# Patient Record
Sex: Male | Born: 1975 | Race: Asian | Hispanic: No | Marital: Married | Smoking: Never smoker
Health system: Southern US, Community
[De-identification: ages and names within clinical notes are randomized; demographics above are authoritative.]

## PROBLEM LIST (undated history)

## (undated) DIAGNOSIS — Z8619 Personal history of other infectious and parasitic diseases: Secondary | ICD-10-CM

## (undated) DIAGNOSIS — J309 Allergic rhinitis, unspecified: Secondary | ICD-10-CM

## (undated) HISTORY — DX: Allergic rhinitis, unspecified: J30.9

## (undated) HISTORY — DX: Personal history of other infectious and parasitic diseases: Z86.19

---

## 2007-05-09 ENCOUNTER — Ambulatory Visit: Payer: Self-pay | Admitting: General Surgery

## 2007-05-29 ENCOUNTER — Ambulatory Visit: Payer: Self-pay | Admitting: General Surgery

## 2007-09-06 HISTORY — PX: NECK SURGERY: SHX720

## 2009-04-16 IMAGING — CT CT NECK WITH CONTRAST
1 of 2 series · 9 of 14 positions shown, 12 images · non-contrast
Comparison: none

REASON FOR EXAM: neck mass
COMMENTS:

[Series 2: soft tissue · axial · 0.45mm/px · z∈[-412,-196]mm · 9 of 92 slices shown, 12 images]
[im 10/92  soft-tissue]
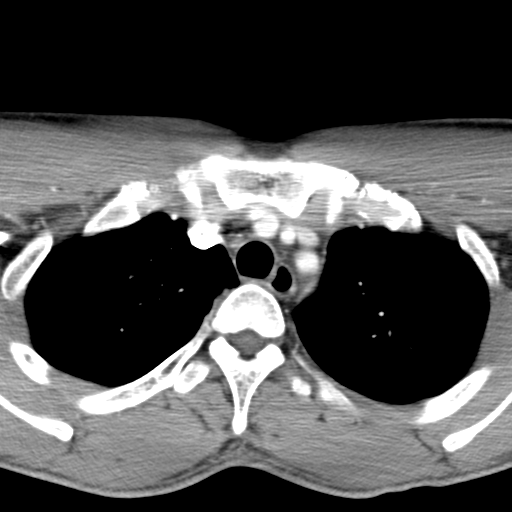
[im 10/92  bone]
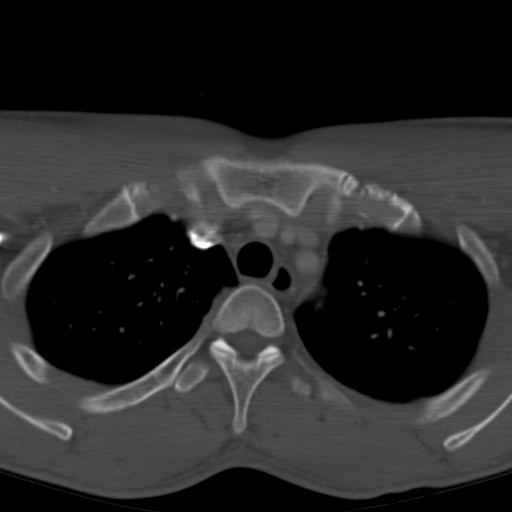
[im 19/92  bone]
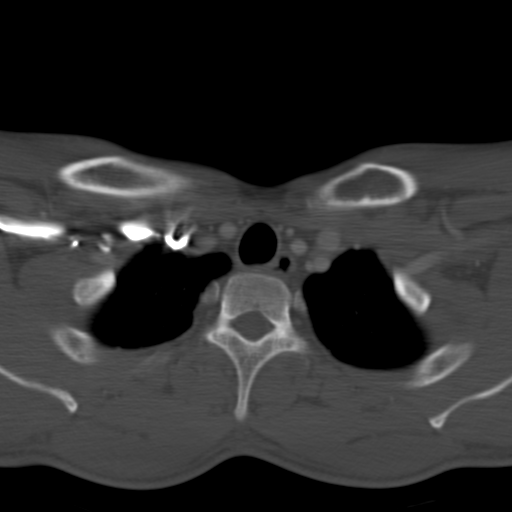
[im 28/92  bone]
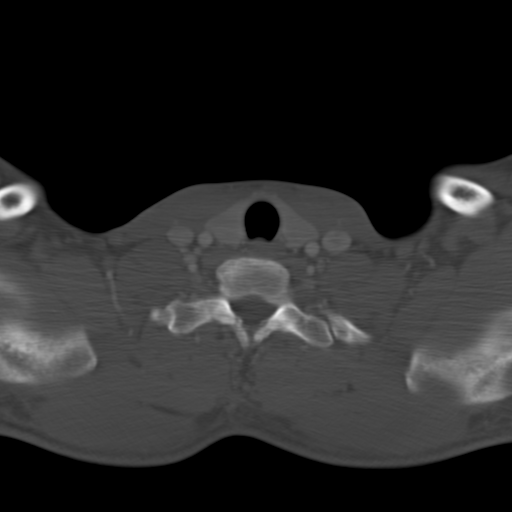
[im 37/92  bone]
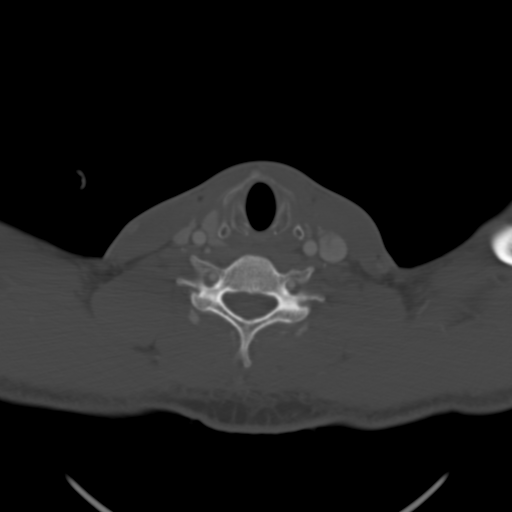
[im 46/92  soft-tissue]
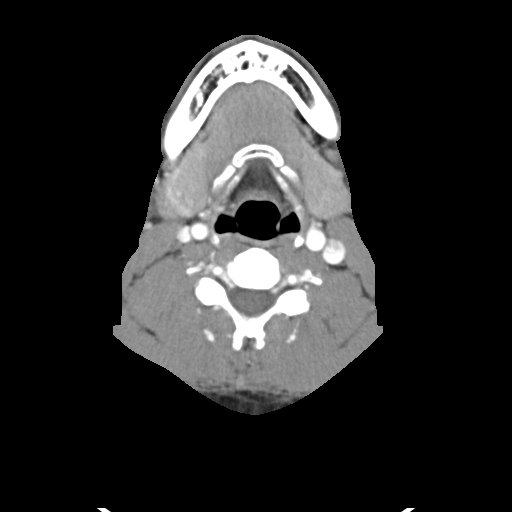
[im 46/92  bone]
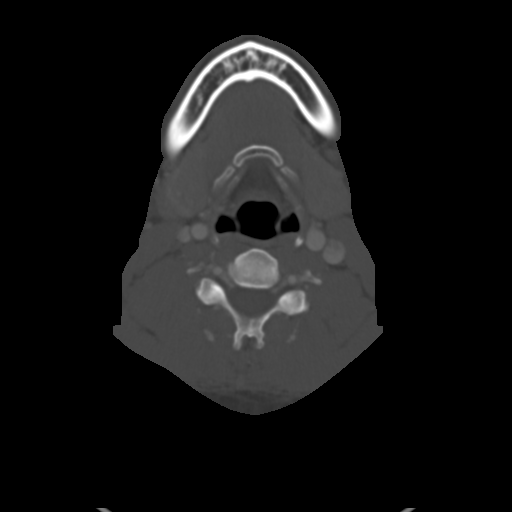
[im 55/92  bone]
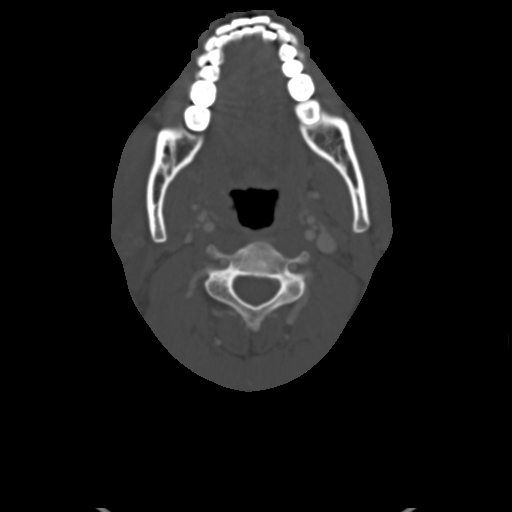
[im 64/92  bone]
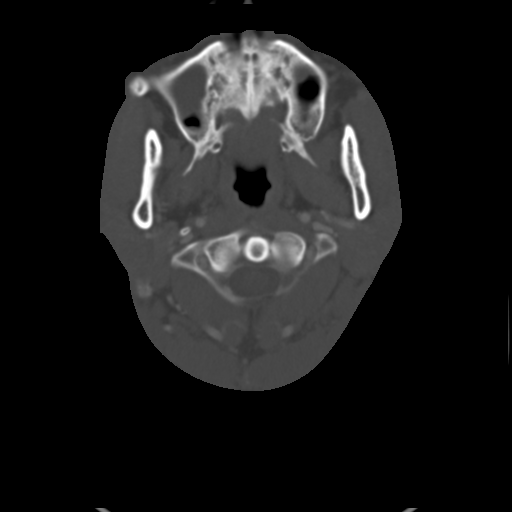
[im 73/92  bone]
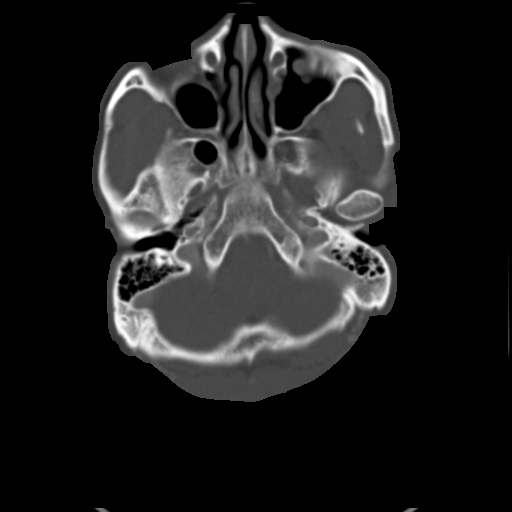
[im 82/92  soft-tissue]
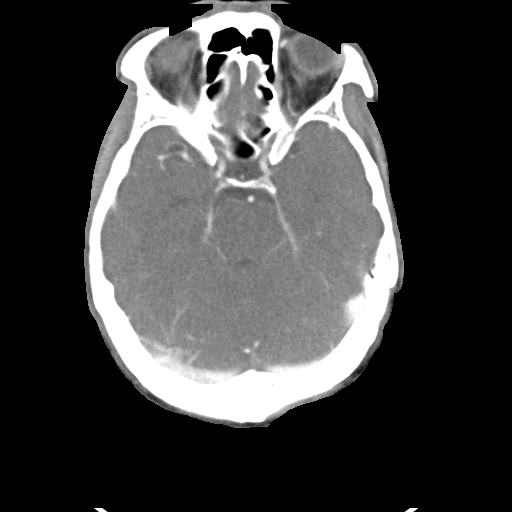
[im 82/92  bone]
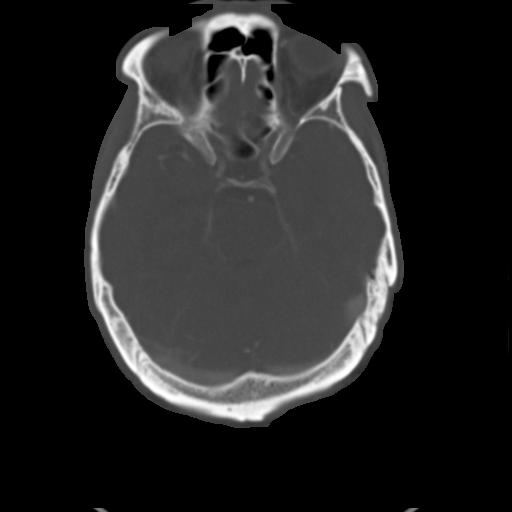

[9 of 14 positions shown; findings below may reference images not displayed]

PROCEDURE:     CT  - CT NECK WITH CONTRAST  - May 09, 2007  [DATE]

RESULT:     The patient has a mass in the region of the lower neck. The area
was marked with a metallic marker. The patient received 75 ml of Bsovue-KTL.

There is increased thickness of the subcutaneous fat in the midline over the
mid and lower neck. There are septations within this fat. There is no
discrete solid mass. I do not see lymphadenopathy in the surrounding soft
tissues. There is a relative paucity of subcutaneous and deeper fat
elsewhere in the neck.

The parotid and submandibular glands are normal in appearance. The laryngeal
structures are normal in appearance. The prevertebral soft tissues spaces
are normal. The thyroid lobes are symmetric in density and size. Muscles
deep to the area of increased thickness in the subcutaneous fat over the
lower neck are normal in appearance. The pulmonary apices are normal in
appearance.
IMPRESSION: 1.     There is increased thickness of the subcutaneous fat over the mid and
lower neck posteriorly. The transverse dimension of this area is
approximately 6.6 cm. The AP dimension of the fat is 1.6 cm. The area is
approximately 5 cm in length.
2.     I see no acute abnormality elsewhere within the neck.
3.     The findings likely reflect a benign process but this area will merit
follow-up. MRI could be considered in an effort to better define the margins
though they are reasonably well defined on today's CT study.

## 2011-05-07 HISTORY — PX: ANAL FISSURE REPAIR: SHX2312

## 2011-05-27 ENCOUNTER — Ambulatory Visit: Payer: Self-pay | Admitting: General Surgery

## 2015-06-25 ENCOUNTER — Encounter: Payer: Self-pay | Admitting: Family Medicine

## 2015-06-25 ENCOUNTER — Ambulatory Visit (INDEPENDENT_AMBULATORY_CARE_PROVIDER_SITE_OTHER): Payer: Commercial Managed Care - PPO | Admitting: Family Medicine

## 2015-06-25 VITALS — BP 110/58 | HR 75 | Temp 98.3°F | Resp 16 | Ht 66.0 in | Wt 167.8 lb

## 2015-06-25 DIAGNOSIS — Z8619 Personal history of other infectious and parasitic diseases: Secondary | ICD-10-CM

## 2015-06-25 DIAGNOSIS — J309 Allergic rhinitis, unspecified: Secondary | ICD-10-CM | POA: Insufficient documentation

## 2015-06-25 DIAGNOSIS — B349 Viral infection, unspecified: Secondary | ICD-10-CM

## 2015-06-25 MED ORDER — AMOXICILLIN-POT CLAVULANATE 875-125 MG PO TABS: 1.0000 | ORAL_TABLET | Freq: Two times a day (BID) | ORAL | Status: DC

## 2015-06-25 NOTE — Patient Instructions (Signed)
Start Mucinex D. If sinuses not improving over the next two days start the antibiotics.

## 2015-06-25 NOTE — Progress Notes (Signed)
Subjective:     Patient ID: Christian Bentley, male   DOB: 02/21/1976, 39 y.o.   MRN: [id number]  HPI  Chief Complaint  Patient presents with  . Cough    Patient comes in office today with concerns of cough and congestion for the past 6 days. Patient was seen at Urgent care over the weekend flu swab was negative in office but physcian treated patient for flu based of symptoms he was displaying at visit. Patient reports that he completed Tamiflu and that the Benzonate has not helped much with cough. Patient complains of sinus pain and pressure which is causing him to sleep at night  Reports initial onset of symptoms on 10/13 with malaise. Subsequently developed flu-like sx with fever to 103. Concerned now about increased sinus pressure with clear to greenish drainage.   Review of Systems  Constitutional: Negative for fatigue (has abated along with body aches).  Respiratory: Positive for cough (intermittent).        Objective:   Physical Exam  Constitutional: He appears well-developed and well-nourished. No distress.  Ears: T.M's intact without inflammation Sinuses: non-tender Throat: no tonsillar enlargement or exudate Neck: no cervical adenopathy Lungs: clear     Assessment:    1. Viral syndrome - amoxicillin-clavulanate (AUGMENTIN) 875-125 MG tablet; Take 1 tablet by mouth 2 (two) times daily.  Dispense: 20 tablet; Refill: 0    Plan:    Add Mucinex D now. Add antibiotic in the next two days if sinuses not clearing.

## 2015-06-27 ENCOUNTER — Ambulatory Visit: Payer: Self-pay

## 2015-07-07 ENCOUNTER — Encounter: Payer: Self-pay | Admitting: Family Medicine

## 2015-07-14 ENCOUNTER — Encounter: Payer: Self-pay | Admitting: Family Medicine

## 2015-07-21 ENCOUNTER — Ambulatory Visit (INDEPENDENT_AMBULATORY_CARE_PROVIDER_SITE_OTHER): Payer: Commercial Managed Care - PPO | Admitting: Family Medicine

## 2015-07-21 ENCOUNTER — Encounter: Payer: Self-pay | Admitting: Family Medicine

## 2015-07-21 VITALS — BP 124/82 | HR 66 | Temp 98.5°F | Resp 16 | Ht 64.0 in | Wt 169.2 lb

## 2015-07-21 DIAGNOSIS — J301 Allergic rhinitis due to pollen: Secondary | ICD-10-CM | POA: Diagnosis not present

## 2015-07-21 DIAGNOSIS — Z0000 Encounter for general adult medical examination without abnormal findings: Secondary | ICD-10-CM | POA: Diagnosis not present

## 2015-07-21 DIAGNOSIS — Z23 Encounter for immunization: Secondary | ICD-10-CM | POA: Diagnosis not present

## 2015-07-21 MED ORDER — FLUTICASONE PROPIONATE 50 MCG/ACT NA SUSP: 2.0000 | Freq: Every day | NASAL | Status: DC

## 2015-07-21 NOTE — Progress Notes (Signed)
Subjective:     Patient ID: Christian Bentley, male   DOB: 04/26/1976, 39 y.o.   MRN: [id number]  HPI  Chief Complaint  Patient presents with  . Annual Exam    Patient comes in office today for his annual physical, his only concern today is to address ongoing cough and drainage for the past month,   States he did not need the antibiotic from ov. Of 10/20. Reports residual clear PND with occasional cough. 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   Review of Systems General: Feeling well HEENT: regular dental visits and eye exams. Had Lasik surgery 1.5 years ago. Cardiovascular: no chest pain, shortness of breath, or palpitations GI: no heartburn, no change in bowel habits  GU: nocturia x 0, no change in bladder habits  Psychiatric: not depressed Musculoskeletal: no joint pain. 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    Objective:   Physical Exam  Constitutional: He appears well-developed and well-nourished. No distress.  Eyes: PERRLA, EOMI Neck: no thyromegaly, tenderness or nodules. No cervical adenopathy.  ENT: TM's intact without inflammation; No tonsillar enlargement or exudate, Lungs: Clear Heart : RRR without murmur or gallop Abd: bowel sounds present, soft, non-tender, no organomegaly Extremities: right fourth finger with pronounced varus deformity. FF/FE strength at his tip is 5/5.     Assessment:    1. Need for influenza vaccination - Flu Vaccine QUAD 36+ mos IM  2. Annual physical exam - Comprehensive metabolic panel - Lipid panel  3. Allergic rhinitis due to polle - fluticasone (FLONASE) 50 MCG/ACT nasal spray; Place 2 sprays into both nostrils daily.  Dispense: 16 g; Refill: 6    Plan:    Further f/u pending lab work

## 2015-07-21 NOTE — Patient Instructions (Signed)
We will call you with the lab results. Try the steroid nasal spray and see if it clears your sinuses.

## 2015-07-22 ENCOUNTER — Telehealth: Payer: Self-pay

## 2015-07-22 LAB — COMPREHENSIVE METABOLIC PANEL
A/G RATIO: 1.5 (ref 1.1–2.5)
ALK PHOS: 64 IU/L (ref 39–117)
ALT: 43 IU/L (ref 0–44)
AST: 27 IU/L (ref 0–40)
Albumin: 4.6 g/dL (ref 3.5–5.5)
BUN / CREAT RATIO: 11 (ref 8–19)
BUN: 11 mg/dL (ref 6–20)
Bilirubin Total: 0.5 mg/dL (ref 0.0–1.2)
CO2: 26 mmol/L (ref 18–29)
Calcium: 9.8 mg/dL (ref 8.7–10.2)
Chloride: 101 mmol/L (ref 97–106)
Creatinine, Ser: 0.99 mg/dL (ref 0.76–1.27)
GFR calc Af Amer: 111 mL/min/{1.73_m2} (ref >59)
GFR calc non Af Amer: 96 mL/min/{1.73_m2} (ref >59)
GLOBULIN, TOTAL: 3.1 g/dL (ref 1.5–4.5)
Glucose: 92 mg/dL (ref 65–99)
Potassium: 4.8 mmol/L (ref 3.5–5.2)
Sodium: 142 mmol/L (ref 136–144)
Total Protein: 7.7 g/dL (ref 6.0–8.5)

## 2015-07-22 LAB — LIPID PANEL
CHOL/HDL RATIO: 4.4 ratio (ref 0.0–5.0)
CHOLESTEROL TOTAL: 224 mg/dL — AB (ref 100–199)
HDL: 51 mg/dL (ref >39)
LDL CALC: 146 mg/dL — AB (ref 0–99)
Triglycerides: 134 mg/dL (ref 0–149)
VLDL CHOLESTEROL CAL: 27 mg/dL (ref 5–40)

## 2015-07-22 NOTE — Telephone Encounter (Signed)
Patient has been advised. KW 

## 2015-07-22 NOTE — Telephone Encounter (Signed)
-----   Message from Anola Gurneyobert Chauvin, [place] sent at 07/22/2015  7:58 AM EST ----- Labs look ok with mildly elevated cholesterol. Recommend regular exercise (30 minutes daily) and low fat food choices. Would recheck cholesterol annually.

## 2016-06-16 DIAGNOSIS — Z23 Encounter for immunization: Secondary | ICD-10-CM | POA: Diagnosis not present

## 2016-09-14 ENCOUNTER — Ambulatory Visit (INDEPENDENT_AMBULATORY_CARE_PROVIDER_SITE_OTHER): Payer: BLUE CROSS/BLUE SHIELD | Admitting: Physician Assistant

## 2016-09-14 ENCOUNTER — Encounter: Payer: Self-pay | Admitting: Physician Assistant

## 2016-09-14 VITALS — BP 102/62 | HR 62 | Temp 98.5°F | Resp 16 | Wt 163.8 lb

## 2016-09-14 DIAGNOSIS — J019 Acute sinusitis, unspecified: Secondary | ICD-10-CM | POA: Diagnosis not present

## 2016-09-14 MED ORDER — AMOXICILLIN-POT CLAVULANATE 875-125 MG PO TABS: 1.0000 | ORAL_TABLET | Freq: Two times a day (BID) | ORAL | 0 refills | Status: AC

## 2016-09-14 NOTE — Patient Instructions (Signed)

## 2016-09-14 NOTE — Progress Notes (Signed)
Patient: Christian Bentley Male    DOB: [birth year]   41 y.o.   MRN: [id number] Visit Date: 09/14/2016  Today's Provider: Trey Sailors, PA-C   Chief Complaint  Patient presents with  . Facial Pain   Subjective:    HPI   Patient comes in office today with concerns of sinus pain and pressure over his entire face, patient reports that symptoms began 09/06/16.  Endorses purulent rhinorrhea, nasal congestion, sneezing. Has tried nettipot with no relief. Having difficulty training for run.   No Known Allergies   Current Outpatient Prescriptions:  .  ibuprofen (ADVIL,MOTRIN) 800 MG tablet, Take 800 mg by mouth every 8 (eight) hours as needed. for pain, Disp: , Rfl: 0 .  amoxicillin-clavulanate (AUGMENTIN) 875-125 MG tablet, Take 1 tablet by mouth 2 (two) times daily., Disp: 20 tablet, Rfl: 0 .  fluticasone (FLONASE) 50 MCG/ACT nasal spray, Place 2 sprays into both nostrils daily. (Patient not taking: Reported on 09/14/2016), Disp: 16 g, Rfl: 6  Review of Systems  Constitutional: Positive for appetite change, fatigue and fever. Negative for activity change, chills, diaphoresis and unexpected weight change.  HENT: Positive for postnasal drip, rhinorrhea, sinus pain, sinus pressure and sneezing. Negative for congestion, dental problem, drooling, ear discharge, ear pain, facial swelling, hearing loss, mouth sores, nosebleeds, sore throat, tinnitus, trouble swallowing and voice change.   Respiratory: Positive for cough and wheezing. Negative for apnea, choking, chest tightness, shortness of breath and stridor.   Cardiovascular: Negative.   Gastrointestinal: Negative.   Endocrine: Negative.   Genitourinary: Negative.   Musculoskeletal: Negative.   Skin: Negative.   Allergic/Immunologic: Negative.   Neurological: Negative.   Hematological: Negative.   Psychiatric/Behavioral: Negative.     Social History  Substance Use Topics  . Smoking status: Never Smoker  . Smokeless tobacco:  Never Used  . Alcohol use 0.0 oz/week     Comment: occasional   Objective:   BP 102/62   Pulse 62   Temp 98.5 F (36.9 C) (Oral)   Resp 16   Wt 163 lb 12.8 oz (74.3 kg)   SpO2 97%   BMI 28.12 kg/m   Physical Exam  Constitutional: He is oriented to person, place, and time. He appears well-developed and well-nourished. No distress.  HENT:  Right Ear: External ear normal.  Left Ear: External ear normal.  Nose: Right sinus exhibits maxillary sinus tenderness and frontal sinus tenderness. Left sinus exhibits maxillary sinus tenderness and frontal sinus tenderness.  Mouth/Throat: Oropharynx is clear and moist and mucous membranes are normal. No oropharyngeal exudate, posterior oropharyngeal edema, posterior oropharyngeal erythema or tonsillar abscesses.  Eyes: Conjunctivae are normal.  Neck: Normal range of motion.  Cardiovascular: Normal rate and regular rhythm.   Pulmonary/Chest: Effort normal and breath sounds normal.  Lymphadenopathy:    He has no cervical adenopathy.  Neurological: He is alert and oriented to person, place, and time.  Skin: Skin is warm and dry. He is not diaphoretic.  Psychiatric: He has a normal mood and affect. His behavior is normal.        Assessment & Plan:     1. Acute non-recurrent sinusitis, unspecified location  Treat as below. Call back if not better.  - amoxicillin-clavulanate (AUGMENTIN) 875-125 MG tablet; Take 1 tablet by mouth 2 (two) times daily.  Dispense: 20 tablet; Refill: 0  Return if symptoms worsen or fail to improve.  The entirety of the information documented in the History of Present Illness, Review of  Systems and Physical Exam were personally obtained by me. Portions of this information were initially documented by Natalia LeatherwoodKatherine and reviewed by me for thoroughness and accuracy.   Patient Instructions  Sinusitis, Adult Sinusitis is soreness and inflammation of your sinuses. Sinuses are hollow spaces in the bones around your face.  They are located:  Around your eyes.  In the middle of your forehead.  Behind your nose.  In your cheekbones. Your sinuses and nasal passages are lined with a stringy fluid (mucus). Mucus normally drains out of your sinuses. When your nasal tissues get inflamed or swollen, the mucus can get trapped or blocked so air cannot flow through your sinuses. This lets bacteria, viruses, and funguses grow, and that leads to infection. Follow these instructions at home: Medicines  Take, use, or apply over-the-counter and prescription medicines only as told by your doctor. These may include nasal sprays.  If you were prescribed an antibiotic medicine, take it as told by your doctor. Do not stop taking the antibiotic even if you start to feel better. Hydrate and Humidify  Drink enough water to keep your pee (urine) clear or pale yellow.  Use a cool mist humidifier to keep the humidity level in your home above 50%.  Breathe in steam for 10-15 minutes, 3-4 times a day or as told by your doctor. You can do this in the bathroom while a hot shower is running.  Try not to spend time in cool or dry air. Rest  Rest as much as possible.  Sleep with your head raised (elevated).  Make sure to get enough sleep each night. General instructions  Put a warm, moist washcloth on your face 3-4 times a day or as told by your doctor. This will help with discomfort.  Wash your hands often with soap and water. If there is no soap and water, use hand sanitizer.  Do not smoke. Avoid being around people who are smoking (secondhand smoke).  Keep all follow-up visits as told by your doctor. This is important. Contact a doctor if:  You have a fever.  Your symptoms get worse.  Your symptoms do not get better within 10 days. Get help right away if:  You have a very bad headache.  You cannot stop throwing up (vomiting).  You have pain or swelling around your face or eyes.  You have trouble seeing.  You  feel confused.  Your neck is stiff.  You have trouble breathing. This information is not intended to replace advice given to you by your health care provider. Make sure you discuss any questions you have with your health care provider. Document Released: 02/08/2008 Document Revised: 04/17/2016 Document Reviewed: 06/17/2015 Elsevier Interactive Patient Education  2017 Elsevier Inc.            Trey SailorsAdriana M Pollak, PA-C  [facility]

## 2017-05-12 ENCOUNTER — Encounter: Payer: Self-pay | Admitting: Family Medicine

## 2017-05-12 ENCOUNTER — Ambulatory Visit: Payer: BLUE CROSS/BLUE SHIELD | Admitting: Family Medicine

## 2017-05-12 ENCOUNTER — Ambulatory Visit (INDEPENDENT_AMBULATORY_CARE_PROVIDER_SITE_OTHER): Payer: BLUE CROSS/BLUE SHIELD | Admitting: Family Medicine

## 2017-05-12 VITALS — BP 100/70 | HR 54 | Temp 98.0°F | Resp 16 | Wt 167.4 lb

## 2017-05-12 DIAGNOSIS — R350 Frequency of micturition: Secondary | ICD-10-CM | POA: Diagnosis not present

## 2017-05-12 LAB — POCT URINALYSIS DIPSTICK
Bilirubin, UA: NEGATIVE
Blood, UA: NEGATIVE
GLUCOSE UA: NEGATIVE
KETONES UA: NEGATIVE
Leukocytes, UA: NEGATIVE
Nitrite, UA: NEGATIVE
Protein, UA: NEGATIVE
Urobilinogen, UA: 0.2 E.U./dL
pH, UA: 5 (ref 5.0–8.0)

## 2017-05-12 NOTE — Patient Instructions (Signed)
Taper caffeine slowly over two weeks and see if you urinate less. Let me know if not improving.

## 2017-05-12 NOTE — Progress Notes (Signed)
Subjective:     Patient ID: Christian Bentley, male   DOB: 04/10/1976, 41 y.o.   MRN: [id number]  HPI  Chief Complaint  Patient presents with  . Urinary Frequency    Patient comes in office today with conerns of urinary frequency and difficulty emptying his bladder for a period of 2 months or more.   States he consumes 2-3 cups of coffee, 1-2 sodas, and one beer daily. On the weekends will drink up to 12 beers. 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 No nocturia or family hx of prostate dz.   Review of Systems     Objective:   Physical Exam  Constitutional: He appears well-developed and well-nourished. No distress.       Assessment:    1. Urinary frequency - POCT urinalysis dipstick    Plan:    Discussed caffeine taper. Consider trial on anxiolytics or abx.

## 2017-10-19 ENCOUNTER — Ambulatory Visit (INDEPENDENT_AMBULATORY_CARE_PROVIDER_SITE_OTHER): Payer: BLUE CROSS/BLUE SHIELD | Admitting: Family Medicine

## 2017-10-19 ENCOUNTER — Encounter: Payer: Self-pay | Admitting: Family Medicine

## 2017-10-19 VITALS — BP 100/74 | HR 59 | Temp 97.7°F | Resp 16 | Ht 65.0 in | Wt 168.6 lb

## 2017-10-19 DIAGNOSIS — Z308 Encounter for other contraceptive management: Secondary | ICD-10-CM | POA: Diagnosis not present

## 2017-10-19 DIAGNOSIS — Z0000 Encounter for general adult medical examination without abnormal findings: Secondary | ICD-10-CM | POA: Diagnosis not present

## 2017-10-19 NOTE — Patient Instructions (Addendum)
We will call you with the lab results and the urology referral. Continue running for exercise.

## 2017-10-19 NOTE — Progress Notes (Signed)
Subjective:     Patient ID: Christian Bentley, male   DOB: 01/05/1976, 42 y.o.   MRN: [id number] Chief Complaint  Patient presents with  . Annual Exam    Patient comes in office today for his annual physical he states that he feels well today and has no complaints in concerns. Patient reports that he follows a well balanaced diet and is eating less pork, patient is staying active running 20miles a well and sleeping on average 6.5-7hrs a sleep. Patient has declined flu vaccine today.    HPI Remains married with two kids aged 788 and 4.  Continues as a Solicitorplant manager. Runs up to 20 miles a week and plays golf.  Review of Systems General: Feeling well HEENT: regular dental visits/ has had Lasik eye surgery. Reports recent right s.c.hemorrhage from sneezing which is nearly resolved Cardiovascular: no chest pain, shortness of breath, or palpitations GI: no heartburn, no change in bowel habits or blood in the stool GU: nocturia x 0, no change in bladder habits. Wishes referral for vasectomy. 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    Objective:   Physical Exam  Constitutional: He appears well-developed and well-nourished. No distress.  Eyes: PERRLA, EOMI, residual area of fading s.c.hemorrhae in right eye. Neck: no thyromegaly, tenderness or nodules,  ENT: TM's intact without inflammation; No tonsillar enlargement or exudate, Lungs: Clear Heart : RRR without murmur or gallop Abd: bowel sounds present, soft, non-tender, no organomegaly Extremities: no edema, Skin: no atypical lesions noted on his back.     Assessment:    1. Annual physical exam - Lipid panel - Comprehensive metabolic panel  2. Surveillance for birth control, vasectomy - Ambulatory referral to Urology    Plan:    Further f/u pending lab work.

## 2017-10-20 ENCOUNTER — Telehealth: Payer: Self-pay

## 2017-10-20 LAB — COMPREHENSIVE METABOLIC PANEL
A/G RATIO: 1.8 (ref 1.2–2.2)
ALK PHOS: 60 IU/L (ref 39–117)
ALT: 36 IU/L (ref 0–44)
AST: 32 IU/L (ref 0–40)
Albumin: 4.6 g/dL (ref 3.5–5.5)
BUN/Creatinine Ratio: 14 (ref 9–20)
BUN: 15 mg/dL (ref 6–24)
Bilirubin Total: 0.5 mg/dL (ref 0.0–1.2)
CO2: 24 mmol/L (ref 20–29)
Calcium: 9.7 mg/dL (ref 8.7–10.2)
Chloride: 102 mmol/L (ref 96–106)
Creatinine, Ser: 1.06 mg/dL (ref 0.76–1.27)
GFR calc Af Amer: 101 mL/min/{1.73_m2} (ref >59)
GFR calc non Af Amer: 87 mL/min/{1.73_m2} (ref >59)
GLOBULIN, TOTAL: 2.6 g/dL (ref 1.5–4.5)
Glucose: 88 mg/dL (ref 65–99)
POTASSIUM: 4.4 mmol/L (ref 3.5–5.2)
SODIUM: 141 mmol/L (ref 134–144)
Total Protein: 7.2 g/dL (ref 6.0–8.5)

## 2017-10-20 LAB — LIPID PANEL
CHOLESTEROL TOTAL: 196 mg/dL (ref 100–199)
Chol/HDL Ratio: 3.8 ratio (ref 0.0–5.0)
HDL: 52 mg/dL (ref >39)
LDL CALC: 127 mg/dL — AB (ref 0–99)
TRIGLYCERIDES: 87 mg/dL (ref 0–149)
VLDL CHOLESTEROL CAL: 17 mg/dL (ref 5–40)

## 2017-10-20 NOTE — Telephone Encounter (Signed)
-----   Message from Anola Gurneyobert Chauvin, [place] sent at 10/20/2017  7:30 AM EST ----- Your labs look good. Your calculated 10 year risk for developing cardiovascular disease if very low at 0.7%. We usually recommend a cholesterol lowering drug at 7.5% risk. Keep up your healthy life style.

## 2017-10-20 NOTE — Telephone Encounter (Signed)
Patient advised.KW 

## 2017-11-10 ENCOUNTER — Ambulatory Visit: Payer: BLUE CROSS/BLUE SHIELD

## 2017-11-17 ENCOUNTER — Ambulatory Visit (INDEPENDENT_AMBULATORY_CARE_PROVIDER_SITE_OTHER): Payer: BLUE CROSS/BLUE SHIELD | Admitting: Urology

## 2017-11-17 ENCOUNTER — Encounter: Payer: Self-pay | Admitting: Urology

## 2017-11-17 VITALS — BP 118/74 | HR 56 | Ht 66.0 in | Wt 164.0 lb

## 2017-11-17 DIAGNOSIS — Z3009 Encounter for other general counseling and advice on contraception: Secondary | ICD-10-CM

## 2017-11-17 MED ORDER — DIAZEPAM 10 MG PO TABS: 10.0000 mg | ORAL_TABLET | Freq: Once | ORAL | 0 refills | Status: AC

## 2017-11-17 NOTE — Progress Notes (Signed)
11/17/2017 2:48 PM   Christian Bentley 12/02/1975 [id number]  Referring provider: Anola Gurneyhauvin, Robert, PA [address] [place], [place] [id number]  Chief Complaint  Patient presents with  . VAS Consult    HPI: The patient is a 42 year old gentleman who presents today for a consultation for a vasectomy.  He has 3 children and desires no more.  He has no previous genitourinary history.  He voids without complaints.  No previous genitourinary surgeries.   PMH: Past Medical History:  Diagnosis Date  . Allergic rhinitis   . History of herpes labialis     Surgical History: Past Surgical History:  Procedure Laterality Date  . ANAL FISSURE REPAIR  05/2011  . NECK SURGERY  2009   growth in neck, biopsy done showed no cancer    Home Medications:  Allergies as of 11/17/2017   No Known Allergies     Medication List        Accurate as of 11/17/17  2:48 PM. Always use your most recent med list.          diazepam 10 MG tablet Commonly known as:  VALIUM Take 1 tablet (10 mg total) by mouth once for 1 dose.   fluticasone 50 MCG/ACT nasal spray Commonly known as:  FLONASE Place 2 sprays into both nostrils daily.   ibuprofen 800 MG tablet Commonly known as:  ADVIL,MOTRIN Take 800 mg by mouth every 8 (eight) hours as needed. for pain       Allergies: No Known Allergies  Family History: Family History  Problem Relation Age of Onset  . Cancer Mother        brain  . Diabetes Mother   . Kidney Stones Father   . Healthy Sister   . Healthy Brother   . Healthy Daughter   . Healthy Son   . Healthy Sister   . Healthy Brother   . Healthy Brother   . Healthy Brother     Social History:  reports that  has never smoked. he has never used smokeless tobacco. He reports that he drinks alcohol. He reports that he uses drugs. Drug: Marijuana.  ROS: UROLOGY Frequent Urination?: No Hard to postpone urination?: No Burning/pain with urination?: No Get up at night to  urinate?: No Leakage of urine?: No Urine stream starts and stops?: No Trouble starting stream?: No Do you have to strain to urinate?: No Blood in urine?: No Urinary tract infection?: No Sexually transmitted disease?: No Injury to kidneys or bladder?: No Painful intercourse?: No Weak stream?: No Erection problems?: No Penile pain?: No  Gastrointestinal Nausea?: No Vomiting?: No Indigestion/heartburn?: No Diarrhea?: No Constipation?: No  Constitutional Fever: No Night sweats?: No Weight loss?: No Fatigue?: No  Skin Skin rash/lesions?: No Itching?: No  Eyes Blurred vision?: No Double vision?: No  Ears/Nose/Throat Sore throat?: No Sinus problems?: No  Hematologic/Lymphatic Swollen glands?: No Easy bruising?: No  Cardiovascular Leg swelling?: No Chest pain?: No  Respiratory Cough?: No Shortness of breath?: No  Endocrine Excessive thirst?: No  Musculoskeletal Back pain?: No Joint pain?: No  Neurological Headaches?: No Dizziness?: No  Psychologic Depression?: No Anxiety?: No  Physical Exam: BP 118/74   Pulse (!) 56   Ht 5\' 6"  (1.676 m)   Wt 164 lb (74.4 kg)   BMI 26.47 kg/m   Constitutional:  Alert and oriented, No acute distress. HEENT: [place] AT, moist mucus membranes.  Trachea midline, no masses. Cardiovascular: No clubbing, cyanosis, or edema. Respiratory: Normal respiratory effort, no increased  work of breathing. GI: Abdomen is soft, nontender, nondistended, no abdominal masses GU: No CVA tenderness.  Normal phallus.  Testicles descended equal bilaterally.  Vas palpable bilaterally. Skin: No rashes, bruises or suspicious lesions. Lymph: No cervical or inguinal adenopathy. Neurologic: Grossly intact, no focal deficits, moving all 4 extremities. Psychiatric: Normal mood and affect.  Laboratory Data: No results found for: WBC, HGB, HCT, MCV, PLT  Lab Results  Component Value Date   CREATININE 1.06 10/19/2017    No results found for:  PSA  No results found for: TESTOSTERONE  No results found for: HGBA1C  Urinalysis    Component Value Date/Time   BILIRUBINUR negative 05/12/2017 1657   PROTEINUR negative 05/12/2017 1657   UROBILINOGEN 0.2 05/12/2017 1657   NITRITE negative 05/12/2017 1657   LEUKOCYTESUR Negative 05/12/2017 1657   Assessment & Plan:    Today, we discussed what the vas deferens is, where it is located, and its function. We reviewed the procedure for vasectomy, it's risks, benefits, alternatives, and likelihood of achieving his goals. We discussed in detail the procedure, complications, and recovery as well as the need for clearance prior to unprotected intercourse. We discussed that vasectomy does not protect against sexually transmitted diseases. We discussed that this procedure does not result in immediate sterility and that they would need to use other forms of birth control until he has been cleared with negative postvasectomy semen analyses. I explained that the procedure is considered to be permanent and that attempts at reversal have varying degrees of success. These options include vasectomy reversal, sperm retrieval, and in vitro fertilization; these can be very expensive. We discussed the chance of postvasectomy pain syndrome which occurs in less than 5% of patients. I explained to the patient that there is no treatment to resolve this chronic pain, and that if it developed I would not be able to help resolve the issue, but that surgery is generally not needed for correction. I explained there have even been reports of systemic like illness associated with this chronic pain, and that there was no good cure. I explained that vasectomy it is not a 100% reliable form of birth control, and the risk of pregnancy after vasectomy is approximately 1 in 2000 men who had a negative postvasectomy semen analysis or rare non-motile sperm. I explained that repeat vasectomy was necessary in less than 1% of vasectomy  procedures when employing the type of technique that I use. I explained that he should refrain from ejaculation for approximately one week following vasectomy. I explained that there are other options for birth control which are permanent and non-permanent; we discussed these. I explained the rates of surgical complications, such as symptomatic hematoma or infection, are low (1-2%) and vary with the surgeon's experience and criteria used to diagnose the complication.  The patient had the opportunity to ask questions to his stated satisfaction. He voiced understanding of the above factors and stated that he has read all the information provided to him and the packets and informed consent  1. Family planning -vasectomy  Return for vasectomy.  Hildred Laser, MD  [facility] [address], [address] [place], [place] [address] [phone]

## 2017-12-04 ENCOUNTER — Ambulatory Visit (INDEPENDENT_AMBULATORY_CARE_PROVIDER_SITE_OTHER): Payer: BLUE CROSS/BLUE SHIELD | Admitting: Family Medicine

## 2017-12-04 ENCOUNTER — Encounter: Payer: Self-pay | Admitting: Family Medicine

## 2017-12-04 VITALS — BP 102/74 | HR 83 | Temp 98.8°F | Resp 16 | Wt 163.0 lb

## 2017-12-04 DIAGNOSIS — B349 Viral infection, unspecified: Secondary | ICD-10-CM

## 2017-12-04 LAB — POC INFLUENZA A&B (BINAX/QUICKVUE)
Influenza A, POC: NEGATIVE
Influenza B, POC: NEGATIVE

## 2017-12-04 MED ORDER — HYDROCODONE-HOMATROPINE 5-1.5 MG/5ML PO SYRP: 5.0000 mL | ORAL_SOLUTION | Freq: Four times a day (QID) | ORAL | 0 refills | Status: DC | PRN

## 2017-12-04 MED ORDER — HYDROCODONE-HOMATROPINE 5-1.5 MG/5ML PO SYRP: 5.0000 mL | ORAL_SOLUTION | Freq: Four times a day (QID) | ORAL | 0 refills | Status: AC | PRN

## 2017-12-04 NOTE — Progress Notes (Signed)
Subjective:     Patient ID: Christian Bentley, male   DOB: 04/30/1976, 42 y.o.   MRN: [id number] Chief Complaint  Patient presents with  . URI    Patient comes in office today with concerns of cold like symptoms that started two days ago. Patient reports lower back ain, cough, runny nose, conge4sytion and fatigue. Patient states that he has been taking Nyquil Cold and Flu   HPI States he felt bad enough to put himself to bed for a couple of days. Denies body aches but developed a fever to 101.  Review of Systems     Objective:   Physical Exam  Constitutional: He appears well-developed and well-nourished. He has a sickly appearance. No distress.  Ears: T.M's intact without inflammation Throat: no tonsillar enlargement or exudate Neck: no cervical adenopathy Lungs: clear     Assessment:    1. Acute viral syndrome: rx for hydrocodone cough syrup - POC Influenza A&B(BINAX/QUICKVUE)    Plan:    Discussed use of otc medication. Phone f/u at end of week if not improving.

## 2017-12-04 NOTE — Patient Instructions (Signed)
Discussed use of Mucinex D for congestion and Delsym for cough. 

## 2018-01-04 ENCOUNTER — Encounter: Payer: Self-pay | Admitting: Urology

## 2018-01-04 ENCOUNTER — Ambulatory Visit (INDEPENDENT_AMBULATORY_CARE_PROVIDER_SITE_OTHER): Payer: BLUE CROSS/BLUE SHIELD | Admitting: Urology

## 2018-01-04 VITALS — BP 110/64 | HR 63 | Ht 66.0 in | Wt 160.0 lb

## 2018-01-04 DIAGNOSIS — Z302 Encounter for sterilization: Secondary | ICD-10-CM | POA: Diagnosis not present

## 2018-01-04 NOTE — Progress Notes (Signed)
01/04/18  CC:  Chief Complaint  Patient presents with  . VAS    HPI: 42 year old male who presents today for vasectomy.  He has 3 biological children and desires no more.  All questions were previously answered and additional questions were answered today.  He is comfortable proceeding.  Consent was reviewed and previously signed.  Blood pressure 110/64, pulse 63, height  (1.676 m), weight 160 lb (72.6 kg). NED. A&Ox3.   No respiratory distress   Abd soft, NT, ND Normal external genitalia with patent urethral meatus   Bilateral Vasectomy Procedure  Pre-Procedure: - Patient's scrotum was prepped and draped for vasectomy. - The vas was palpated through the scrotal skin on the left. - 1% Xylocaine was injected into the skin and surrounding tissue for placement  - In a similar manner, the vas on the right was identified, anesthetized, and stabilized.  Procedure: -A small stab incision was made using 11 blade in the skin overlying the vas - The left vas was isolated and brought up through the incision exposing that structure. - Bleeding points were cauterized as they occurred. - The vas was free from the surrounding structures and brought to the view. - A segment was positioned for placement with a hemostat. - A second hemostat was placed and a small segment between the two hemostats and was removed for inspection. - Each end of the transected vas lumen was fulgurated/ obliterated using needlepoint electrocautery -A fascial interposition was performed on testicular end of the vas using #3-0 chromic suture -The same procedure was performed on the right. - A single suture of #3-0 chromic catgut was used to close each lateral scrotal skin incision - A dressing was applied.  Post-Procedure: - Patient was instructed in care of the operative area - A specimen is to be delivered in 12 weeks   -Another form of contraception is to be used until post vasectomy semen analysis  Vanna Scotland, MD

## 2018-04-11 ENCOUNTER — Other Ambulatory Visit: Payer: BLUE CROSS/BLUE SHIELD

## 2018-04-12 ENCOUNTER — Other Ambulatory Visit: Payer: Self-pay

## 2018-04-12 ENCOUNTER — Other Ambulatory Visit: Payer: BLUE CROSS/BLUE SHIELD

## 2018-04-12 DIAGNOSIS — Z302 Encounter for sterilization: Secondary | ICD-10-CM

## 2018-05-08 ENCOUNTER — Encounter: Payer: Self-pay | Admitting: Family Medicine

## 2018-05-08 ENCOUNTER — Ambulatory Visit (INDEPENDENT_AMBULATORY_CARE_PROVIDER_SITE_OTHER): Payer: BLUE CROSS/BLUE SHIELD | Admitting: Family Medicine

## 2018-05-08 VITALS — BP 94/58 | HR 54 | Temp 97.6°F | Resp 15 | Wt 170.0 lb

## 2018-05-08 DIAGNOSIS — J069 Acute upper respiratory infection, unspecified: Secondary | ICD-10-CM | POA: Diagnosis not present

## 2018-05-08 MED ORDER — AMOXICILLIN-POT CLAVULANATE 875-125 MG PO TABS: 1.0000 | ORAL_TABLET | Freq: Two times a day (BID) | ORAL | 0 refills | Status: DC

## 2018-05-08 NOTE — Patient Instructions (Signed)
Discussed use of Mucinex D for congestion, Delsym for cough, and Benadryl for postnasal drainage. Start the antibiotic if your sinuses are not improving by the end of the week.

## 2018-05-08 NOTE — Progress Notes (Signed)
  Subjective:     Patient ID: Christian Bentley, male   DOB: 1975-10-04, 42 y.o.   MRN: [id number] Chief Complaint  Patient presents with  . Sinus Problem    patient comes in office today with complaints of sinus pain and pressure for the past 5 days. Patient reports symptoms of fatigue, cough, fever, sore throat and being horse. Patient states that he has tried using a nettie pot for relief.    HPI Reports cold sx onset 8/29.  Review of Systems     Objective:   Physical Exam  Constitutional: He appears well-developed and well-nourished. He has a sickly appearance. No distress.  Ears: T.M's intact without inflammation Sinuses: non-tender Throat: no tonsillar enlargement or exudate Neck: no cervical adenopathy Lungs: clear     Assessment:    1. Viral upper respiratory tract infection - amoxicillin-clavulanate (AUGMENTIN) 875-125 MG tablet; Take 1 tablet by mouth 2 (two) times daily.  Dispense: 20 tablet; Refill: 0    Plan:    Discussed use of Mucinex D for congestion, Delsym for cough, and Benadryl for postnasal drainage. Will start abx if sinus drainage not clearing by 05/11/18.

## 2019-02-01 DIAGNOSIS — S0501XA Injury of conjunctiva and corneal abrasion without foreign body, right eye, initial encounter: Secondary | ICD-10-CM | POA: Diagnosis not present

## 2019-02-01 DIAGNOSIS — H11421 Conjunctival edema, right eye: Secondary | ICD-10-CM | POA: Diagnosis not present

## 2019-02-01 DIAGNOSIS — H11431 Conjunctival hyperemia, right eye: Secondary | ICD-10-CM | POA: Diagnosis not present

## 2019-02-02 DIAGNOSIS — H11431 Conjunctival hyperemia, right eye: Secondary | ICD-10-CM | POA: Diagnosis not present

## 2019-02-02 DIAGNOSIS — S0500XD Injury of conjunctiva and corneal abrasion without foreign body, unspecified eye, subsequent encounter: Secondary | ICD-10-CM | POA: Diagnosis not present

## 2019-07-10 ENCOUNTER — Encounter: Payer: BLUE CROSS/BLUE SHIELD | Admitting: Physician Assistant

## 2019-07-24 ENCOUNTER — Other Ambulatory Visit: Payer: Self-pay

## 2019-07-24 ENCOUNTER — Ambulatory Visit (INDEPENDENT_AMBULATORY_CARE_PROVIDER_SITE_OTHER): Payer: BC Managed Care – PPO | Admitting: Physician Assistant

## 2019-07-24 ENCOUNTER — Encounter: Payer: Self-pay | Admitting: Physician Assistant

## 2019-07-24 VITALS — BP 110/73 | HR 67 | Temp 96.6°F | Ht 66.0 in | Wt 175.4 lb

## 2019-07-24 DIAGNOSIS — Z23 Encounter for immunization: Secondary | ICD-10-CM

## 2019-07-24 DIAGNOSIS — Z0000 Encounter for general adult medical examination without abnormal findings: Secondary | ICD-10-CM | POA: Diagnosis not present

## 2019-07-24 NOTE — Patient Instructions (Signed)

## 2019-07-24 NOTE — Progress Notes (Signed)
Patient: Christian Bentley, Male    DOB: 07/03/1976, 43 y.o.   MRN: [id number] Visit Date: 07/24/2019  Today's Provider: Trey SailorsAdriana M Pollak, PA-C   Chief Complaint  Patient presents with  . Annual Exam   Subjective:    Annual physical exam Christian Brashet Marchetta is a 43 y.o. male who presents today for health maintenance and complete physical. He feels well. He reports exercising includes workout. He reports he is sleeping well.  Product managerQuality manager. Lives with wife in [place], kids ages 356 and 6110. Previously saw Toni ArthursBob Chauvin, PA-C who has since retired.   No family history colon cancer prostate, no prostate cancer.   No smoking, no drugs, moderate alcohol.   Wt Readings from Last 3 Encounters:  07/24/19 175 lb 6.4 oz (79.6 kg)  05/08/18 170 lb (77.1 kg)  01/04/18 160 lb (72.6 kg)    -----------------------------------------------------------------   Review of Systems  Constitutional: Negative.   HENT: Negative.   Eyes: Negative.   Respiratory: Negative.   Cardiovascular: Negative.   Gastrointestinal: Negative.   Endocrine: Negative.   Genitourinary: Negative.   Musculoskeletal: Negative.   Skin: Negative.   Allergic/Immunologic: Negative.   Neurological: Negative.   Hematological: Negative.   Psychiatric/Behavioral: Negative.     Social History He  reports that he has never smoked. He has never used smokeless tobacco. He reports current alcohol use. He reports current drug use. Drug: Marijuana. Social History   Socioeconomic History  . Marital status: Married    Spouse name: Not on file  . Number of children: Not on file  . Years of education: Not on file  . Highest education level: Not on file  Occupational History  . Not on file  Social Needs  . Financial resource strain: Not on file  . Food insecurity    Worry: Not on file    Inability: Not on file  . Transportation needs    Medical: Not on file    Non-medical: Not on file  Tobacco Use  . Smoking  status: Never Smoker  . Smokeless tobacco: Never Used  Substance and Sexual Activity  . Alcohol use: Yes    Alcohol/week: 0.0 standard drinks    Comment: occasional  . Drug use: Yes    Types: Marijuana    Comment: history of use  . Sexual activity: Not on file  Lifestyle  . Physical activity    Days per week: Not on file    Minutes per session: Not on file  . Stress: Not on file  Relationships  . Social Musicianconnections    Talks on phone: Not on file    Gets together: Not on file    Attends religious service: Not on file    Active member of club or organization: Not on file    Attends meetings of clubs or organizations: Not on file    Relationship status: Not on file  Other Topics Concern  . Not on file  Social History Narrative  . Not on file    Patient Active Problem List   Diagnosis Date Noted  . Allergic rhinitis 06/25/2015  . 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 ANAL FISSURE REPAIR  05/2011  . NECK SURGERY  2009   growth in neck, biopsy done showed no cancer    Family History  Family Status  Relation Name Status  . Mother  Deceased       cancer  .  Father  Alive  . Sister  Alive  . Brother  Alive  . Daughter  Alive  . Son  Alive  . Sister  Alive  . Brother  Alive  . Brother  Alive  . Brother  Alive   His family history includes Cancer in his mother; Diabetes in his mother; Healthy in his brother, brother, brother, brother, daughter, sister, sister, and son; Kidney Stones in his father.     No Known Allergies  Previous Medications   AMOXICILLIN-CLAVULANATE (AUGMENTIN) 875-125 MG TABLET    Take 1 tablet by mouth 2 (two) times daily.   FLUTICASONE (FLONASE) 50 MCG/ACT NASAL SPRAY    Place 2 sprays into both nostrils daily.   IBUPROFEN (ADVIL,MOTRIN) 800 MG TABLET    Take 800 mg by mouth every 8 (eight) hours as needed. for pain    Patient Care Team: Paulene Floor as PCP - General (Physician  Assistant)      Objective:   Vitals: BP 110/73 (BP Location: Left Arm, Patient Position: Sitting, Cuff Size: Normal)   Pulse 67   Temp (!) 96.6 F (35.9 C) (Temporal)   Ht 5\' 6"  (1.676 m)   Wt 175 lb 6.4 oz (79.6 kg)   BMI 28.31 kg/m    Physical Exam Constitutional:      Appearance: Normal appearance.  HENT:     Right Ear: Tympanic membrane and ear canal normal.     Left Ear: Tympanic membrane and ear canal normal.  Cardiovascular:     Rate and Rhythm: Normal rate and regular rhythm.     Heart sounds: Normal heart sounds.  Pulmonary:     Effort: Pulmonary effort is normal.     Breath sounds: Normal breath sounds.  Abdominal:     General: Bowel sounds are normal.     Palpations: Abdomen is soft.  Genitourinary:    Rectum: Normal.  Skin:    General: Skin is warm and dry.  Neurological:     Mental Status: He is alert and oriented to person, place, and time. Mental status is at baseline.  Psychiatric:        Mood and Affect: Mood normal.        Behavior: Behavior normal.      Depression Screen PHQ 2/9 Scores 07/24/2019 07/21/2015  PHQ - 2 Score 0 0  PHQ- 9 Score 0 -      Assessment & Plan:     Routine Health Maintenance and Physical Exam  Exercise Activities and Dietary recommendations Goals   None     Immunization History  Administered Date(s) Administered  . Influenza,inj,Quad PF,6+ Mos 07/21/2015, 07/24/2019  . Td 10/25/1996  . Tdap 12/25/2012    Health Maintenance  Topic Date Due  . HIV Screening  02/07/1992  . INFLUENZA VACCINE  04/06/2019  . TETANUS/TDAP  12/26/2022     Discussed health benefits of physical activity, and encouraged him to engage in regular exercise appropriate for his age and condition.    1. Annual physical exam  - CBC with Differential/Platelet - Comprehensive Metabolic Panel (CMET) - Lipid Profile - TSH - HIV antibody (with reflex)  2. Need for influenza vaccination  - Flu Vaccine QUAD 6+ mos PF IM (Fluarix  Quad PF)  The entirety of the information documented in the History of Present Illness, Review of Systems and Physical Exam were personally obtained by me. Portions of this information were initially documented by [facility], CMA and reviewed by me for thoroughness and accuracy.  F/u 1 year CPE --------------------------------------------------------------------

## 2019-07-25 LAB — COMPREHENSIVE METABOLIC PANEL
ALT: 41 IU/L (ref 0–44)
AST: 27 IU/L (ref 0–40)
Albumin/Globulin Ratio: 1.9 (ref 1.2–2.2)
Albumin: 4.5 g/dL (ref 4.0–5.0)
Alkaline Phosphatase: 57 IU/L (ref 39–117)
BUN/Creatinine Ratio: 12 (ref 9–20)
BUN: 13 mg/dL (ref 6–24)
Bilirubin Total: 0.7 mg/dL (ref 0.0–1.2)
CO2: 24 mmol/L (ref 20–29)
Calcium: 9.4 mg/dL (ref 8.7–10.2)
Chloride: 101 mmol/L (ref 96–106)
Creatinine, Ser: 1.1 mg/dL (ref 0.76–1.27)
GFR calc Af Amer: 95 mL/min/{1.73_m2} (ref >59)
GFR calc non Af Amer: 82 mL/min/{1.73_m2} (ref >59)
Globulin, Total: 2.4 g/dL (ref 1.5–4.5)
Glucose: 89 mg/dL (ref 65–99)
Potassium: 4.1 mmol/L (ref 3.5–5.2)
Sodium: 140 mmol/L (ref 134–144)
Total Protein: 6.9 g/dL (ref 6.0–8.5)

## 2019-07-25 LAB — CBC WITH DIFFERENTIAL/PLATELET
Basophils Absolute: 0 10*3/uL (ref 0.0–0.2)
Basos: 1 %
EOS (ABSOLUTE): 0.6 10*3/uL — ABNORMAL HIGH (ref 0.0–0.4)
Eos: 11 %
Hematocrit: 45.6 % (ref 37.5–51.0)
Hemoglobin: 14.8 g/dL (ref 13.0–17.7)
Immature Grans (Abs): 0 10*3/uL (ref 0.0–0.1)
Immature Granulocytes: 0 %
Lymphocytes Absolute: 1.4 10*3/uL (ref 0.7–3.1)
Lymphs: 25 %
MCH: 27.4 pg (ref 26.6–33.0)
MCHC: 32.5 g/dL (ref 31.5–35.7)
MCV: 84 fL (ref 79–97)
Monocytes Absolute: 0.4 10*3/uL (ref 0.1–0.9)
Monocytes: 7 %
Neutrophils Absolute: 3.1 10*3/uL (ref 1.4–7.0)
Neutrophils: 56 %
Platelets: 247 10*3/uL (ref 150–450)
RBC: 5.41 x10E6/uL (ref 4.14–5.80)
RDW: 12.1 % (ref 11.6–15.4)
WBC: 5.6 10*3/uL (ref 3.4–10.8)

## 2019-07-25 LAB — LIPID PANEL
Chol/HDL Ratio: 4.7 ratio (ref 0.0–5.0)
Cholesterol, Total: 238 mg/dL — ABNORMAL HIGH (ref 100–199)
HDL: 51 mg/dL (ref >39)
LDL Chol Calc (NIH): 160 mg/dL — ABNORMAL HIGH (ref 0–99)
Triglycerides: 149 mg/dL (ref 0–149)
VLDL Cholesterol Cal: 27 mg/dL (ref 5–40)

## 2019-07-25 LAB — TSH: TSH: 1.27 u[IU]/mL (ref 0.450–4.500)

## 2019-07-25 LAB — HIV ANTIBODY (ROUTINE TESTING W REFLEX): HIV Screen 4th Generation wRfx: NONREACTIVE

## 2019-07-26 ENCOUNTER — Telehealth: Payer: Self-pay

## 2019-07-26 NOTE — Telephone Encounter (Signed)
Patient was advised.  

## 2019-07-26 NOTE — Telephone Encounter (Signed)
-----   Message from Trinna Post, [place] sent at 07/25/2019  8:28 AM EST ----- Cholesterol is higher than last year. Will likely go down as patient works on exercise and eating habits. Remaining labs are normal. See him next year for CPE.

## 2019-11-28 ENCOUNTER — Ambulatory Visit: Payer: Self-pay | Attending: Internal Medicine

## 2019-11-28 DIAGNOSIS — Z23 Encounter for immunization: Secondary | ICD-10-CM

## 2019-11-28 NOTE — Progress Notes (Signed)
   Covid-19 Vaccination Clinic  Name:  Christian Bentley    MRN: [id number] DOB: 1975/10/18  11/28/2019  [name] was observed post Covid-19 immunization for 15 minutes without incident. He was provided with Vaccine Information Sheet and instruction to access the V-Safe system.   [name] was instructed to call 911 with any severe reactions post vaccine: Marland Kitchen Difficulty breathing  . Swelling of face and throat  . A fast heartbeat  . A bad rash all over body  . Dizziness and weakness   Immunizations Administered    Name Date Dose VIS Date Route   Pfizer COVID-19 Vaccine 11/28/2019  3:32 PM 0.3 mL 08/16/2019 Intramuscular   Manufacturer: [employer], [employer]   Lot: [id number]   NDC: [address]-8

## 2019-12-23 ENCOUNTER — Ambulatory Visit: Payer: Self-pay | Attending: Internal Medicine

## 2019-12-23 DIAGNOSIS — Z23 Encounter for immunization: Secondary | ICD-10-CM

## 2019-12-23 NOTE — Progress Notes (Signed)
   Covid-19 Vaccination Clinic  Name:  Shakim Faith    MRN: [id number] DOB: 1975-10-29  12/23/2019  Mr. Smoot was observed post Covid-19 immunization for 15 minutes without incident. He was provided with Vaccine Information Sheet and instruction to access the V-Safe system.   Mr. Fung was instructed to call 911 with any severe reactions post vaccine: Marland Kitchen Difficulty breathing  . Swelling of face and throat  . A fast heartbeat  . A bad rash all over body  . Dizziness and weakness   Immunizations Administered    Name Date Dose VIS Date Route   Pfizer COVID-19 Vaccine 12/23/2019  1:22 PM 0.3 mL 10/30/2018 Intramuscular   Manufacturer: [employer], [employer]   Lot: [id number]   NDC: [address]-8

## 2020-01-25 DIAGNOSIS — K56609 Unspecified intestinal obstruction, unspecified as to partial versus complete obstruction: Secondary | ICD-10-CM | POA: Diagnosis not present

## 2020-01-25 DIAGNOSIS — Z20822 Contact with and (suspected) exposure to covid-19: Secondary | ICD-10-CM | POA: Diagnosis not present

## 2020-01-25 DIAGNOSIS — R109 Unspecified abdominal pain: Secondary | ICD-10-CM | POA: Diagnosis not present

## 2020-01-25 DIAGNOSIS — R1031 Right lower quadrant pain: Secondary | ICD-10-CM | POA: Diagnosis not present

## 2020-01-25 DIAGNOSIS — Z803 Family history of malignant neoplasm of breast: Secondary | ICD-10-CM | POA: Diagnosis not present

## 2020-01-26 DIAGNOSIS — K56609 Unspecified intestinal obstruction, unspecified as to partial versus complete obstruction: Secondary | ICD-10-CM | POA: Diagnosis not present

## 2020-01-26 DIAGNOSIS — Z20822 Contact with and (suspected) exposure to covid-19: Secondary | ICD-10-CM | POA: Diagnosis not present

## 2020-01-26 DIAGNOSIS — R109 Unspecified abdominal pain: Secondary | ICD-10-CM | POA: Diagnosis not present

## 2020-01-26 DIAGNOSIS — Z803 Family history of malignant neoplasm of breast: Secondary | ICD-10-CM | POA: Diagnosis not present

## 2020-01-26 DIAGNOSIS — K56699 Other intestinal obstruction unspecified as to partial versus complete obstruction: Secondary | ICD-10-CM | POA: Diagnosis not present

## 2020-01-29 ENCOUNTER — Other Ambulatory Visit: Payer: Self-pay

## 2020-01-29 ENCOUNTER — Encounter: Payer: Self-pay | Admitting: Physician Assistant

## 2020-01-29 ENCOUNTER — Ambulatory Visit (INDEPENDENT_AMBULATORY_CARE_PROVIDER_SITE_OTHER): Payer: BC Managed Care – PPO | Admitting: Physician Assistant

## 2020-01-29 VITALS — BP 108/72 | HR 58 | Temp 96.8°F | Wt 165.8 lb

## 2020-01-29 DIAGNOSIS — K56609 Unspecified intestinal obstruction, unspecified as to partial versus complete obstruction: Secondary | ICD-10-CM | POA: Diagnosis not present

## 2020-01-29 NOTE — Progress Notes (Signed)
     Established patient visit   Patient: Christian Bentley   DOB: 10/15/1975   44 y.o. Male  MRN: [id number] Visit Date: 01/29/2020  Today's healthcare provider: Trey Sailors, PA-C   Chief Complaint  Patient presents with  . Hospitalization Follow-up   [employer] as a scribe for Trey Sailors, PA-C.,have documented all relevant documentation on the behalf of Trey Sailors, PA-C,as directed by  Trey Sailors, PA-C while in the presence of Trey Sailors, PA-C.  Subjective    HPI Follow up Hospitalization  Patient was admitted to on 01/26/2020 and discharged on 01/28/2020 from [facility] at [place], [place].  He was treated for small bowel obstruction. Treatment for this included CT scan, NG tube.  Telephone follow up was done on N/A He reports good compliance with treatment. He reports this condition is slightly improved. Has been eating 25% of normal and having small bowel movement.  ----------------------------------------------------------------------------------------- -     Medications: Outpatient Medications Prior to Visit  Medication Sig  . ibuprofen (ADVIL,MOTRIN) 800 MG tablet Take 800 mg by mouth every 8 (eight) hours as needed. for pain   No facility-administered medications prior to visit.    Review of Systems  Constitutional: Negative.   Respiratory: Negative.   Genitourinary: Negative.   Hematological: Negative.       Objective    BP 108/72 (BP Location: Left Arm, Patient Position: Sitting, Cuff Size: Normal)   Pulse (!) 58   Temp (!) 96.8 F (36 C) (Temporal)   Wt 165 lb 12.8 oz (75.2 kg)   BMI 26.76 kg/m    Physical Exam Constitutional:      Appearance: Normal appearance.  Cardiovascular:     Rate and Rhythm: Normal rate and regular rhythm.     Pulses: Normal pulses.     Heart sounds: Normal heart sounds.  Pulmonary:     Effort: Pulmonary effort is normal.     Breath sounds: Normal breath sounds.    Abdominal:     General: Bowel sounds are normal.     Palpations: Abdomen is soft.  Skin:    General: Skin is warm and dry.  Neurological:     General: No focal deficit present.     Mental Status: He is alert and oriented to person, place, and time.  Psychiatric:        Mood and Affect: Mood normal.        Behavior: Behavior normal.     No results found for any visits on 01/29/20.  Assessment & Plan    1. Small bowel obstruction (HCC) Will refer to GI for further evaluation. Patient ate a regular diet today will wait to see if pain reoccurs. Will call back if he decides to proceed with abdominal X-ray. Patient will try to get medical records from ER at [facility] for GI.   - Ambulatory referral to Gastroenterology   Return if symptoms worsen or fail to improve.      ITrey Sailors, PA-C, have reviewed all documentation for this visit. The documentation on 01/29/20 for the exam, diagnosis, procedures, and orders are all accurate and complete.  I have spent 15 minutes with this patient, >50% of which was spent on counseling and coordination of care.     Maryella Shivers  [facility] [phone] (phone) [phone] (fax)  [facility]

## 2020-02-10 ENCOUNTER — Other Ambulatory Visit: Payer: Self-pay

## 2020-02-10 ENCOUNTER — Ambulatory Visit (INDEPENDENT_AMBULATORY_CARE_PROVIDER_SITE_OTHER): Payer: BC Managed Care – PPO | Admitting: Physician Assistant

## 2020-02-10 ENCOUNTER — Encounter: Payer: Self-pay | Admitting: Physician Assistant

## 2020-02-10 VITALS — BP 120/80 | HR 58 | Temp 96.8°F | Wt 163.6 lb

## 2020-02-10 DIAGNOSIS — R070 Pain in throat: Secondary | ICD-10-CM | POA: Diagnosis not present

## 2020-02-10 MED ORDER — HYDROXYZINE HCL 10 MG PO TABS: 10.0000 mg | ORAL_TABLET | Freq: Three times a day (TID) | ORAL | 0 refills | Status: DC | PRN

## 2020-02-10 NOTE — Patient Instructions (Addendum)
pepcid 20 mg 30 min before a meal on an empty stomach twice per day.    Esophageal Stricture  Esophageal stricture is a narrowing (stricture) of the esophagus. The esophagus is the part of the body that moves food and liquid from your mouth to your stomach. The esophagus can become narrow because of disease or damage to the area. This condition can make swallowing difficult, painful, or even impossible. It also makes choking more likely. What are the causes? The most common cause of this condition is gastroesophageal reflux disease (GERD). Normally, food travels down the esophagus and stays in the stomach to be digested. In GERD, food and stomach acid move back up into the esophagus. Over time, this causes scar tissue and leads to narrowing. Other causes of esophageal stricture include:  Scarring from swallowing (ingesting) a harmful substance.  Damage from medical instruments used in the esophagus.  Radiation therapy.  Cancer.  Inflammation of the esophagus. What increases the risk? You are more likely to develop an esophageal stricture if you have GERD or esophageal cancer. What are the signs or symptoms? Symptoms of this condition include:  Difficulty swallowing.  Pain when swallowing.  Burning pain or discomfort in the throat or chest (heartburn).  Vomiting or spitting up (regurgitating) food or liquids.  Unexplained weight loss. How is this diagnosed? This condition may be diagnosed based on:  Your symptoms and a physical exam.  Tests, such as: ? Upper endoscopy. Your health care provider will insert a flexible tube with a tiny camera on it (endoscope) into your esophagus to check for a stricture. A tissue sample (biopsy) may also be taken to be examined under a microscope. ? Esophageal pH monitoring. This test involves using a tube to collect acid in the esophagus to determine how much stomach acid is entering the esophagus. ? Barium swallow test. For this test, you  will drink a chalky liquid (barium solution) that coats the lining of the esophagus. Then you will have an X-ray taken. The barium solution helps to show if there is a stricture. How is this treated? Treatment for esophageal stricture depends on what is causing your condition and how severe your condition is. Treatment options include:  Esophageal dilation. In this procedure, a health care provider inserts an endoscope or a tool called a dilator into the esophagus to gently stretch it and make the opening wider.  Stents. In some cases, a health care provider may place a small device (stent) in the esophagus to keep it open.  Acid-blocking medicines. Taking these can help you manage GERD symptoms after an esophageal stricture. Controlling your GERD symptoms or being free of them can prevent the stricture from returning. Follow these instructions at home: Eating and drinking      Follow instructions from your health care provider about any diet changes.  Cut your food into small pieces, chew well, and eat slowly  Try to eat soft food that is easier to swallow.  Eat and drink only when you are sitting upright.  Do not drink alcohol. If you need help quitting, ask your health care provider.  Do not eat during the 3 hours before bedtime.  Do not overeat at meals.  Do not eat foods that can make reflux worse. These include: ? Fatty foods, such as red meat and processed foods. ? Spicy foods. ? Soda. ? Tomato products. ? Chocolate. General instructions  Take over-the-counter and prescription medicines only as told by your health care provider.  Do  not use any products that contain nicotine or tobacco, such as cigarettes and e-cigarettes. If you need help quitting, ask your health care provider.  Lose weight if you are overweight.  Wear loose, comfortable clothing.  When lying in bed, raise (elevate) your head with pillows. This will help to prevent your stomach contents from  backing up into your esophagus while you sleep.  Keep all follow-up visits as told by your health care provider. This is important. Contact a health care provider if:  You have problems eating or swallowing.  You regurgitate food and liquid.  Your symptoms do not improve with treatment. Get help right away if:  You can no longer keep down any food, drinks, or your saliva. Summary  Esophageal stricture is a narrowing of the part of the body that moves food and liquid from your mouth to your stomach (esophagus).  The esophagus can become narrow because of disease or damage to the area. This can make swallowing difficult, painful, or even impossible.  Treatment for esophageal stricture depends on what is causing your condition and how severe your condition is. In some cases, procedures may be done to make the opening of the esophagus wider or to place a stent in the esophagus to keep it open.  Do not drink alcohol, overeat at meals, or eat foods that can make reflux worse. This information is not intended to replace advice given to you by your health care provider. Make sure you discuss any questions you have with your health care provider. Document Revised: 11/17/2017 Document Reviewed: 04/28/2017 Elsevier Patient Education  [place].

## 2020-02-10 NOTE — Progress Notes (Signed)
Established patient visit   Patient: Christian Bentley   DOB: 02/09/1976   44 y.o. Male  MRN: [id number] Visit Date: 02/10/2020  Today's healthcare provider: Trinna Post, PA-C   Chief Complaint  Patient presents with  . Chest Pain  I,Norm Wray M Adeli Frost,acting as a scribe for Trinna Post, PA-C.,have documented all relevant documentation on the behalf of Trinna Post, PA-C,as directed by  Trinna Post, PA-C while in the presence of Trinna Post, PA-C.  Subjective    HPI Patient presents today for discomfort of his throat since yesterday, 02/09/2020. Patient had a SBO 3 weeks ago and he was admitted to the hospital requiring gastric decompression with NG tube. He was discharged with resolution of symptoms about 10 days ago. He states that it feels like something is stuck down in is throat. He is not choking on liquids or solids. This happened yesterday for the first time and he does not have a history of similar symptoms. He did have an NG tube but did not have symptoms immediately after leaving. He is having some anxiety associated with this. Has been having bowel movements. He has not been nauseated.      Medications: Outpatient Medications Prior to Visit  Medication Sig  . ibuprofen (ADVIL,MOTRIN) 800 MG tablet Take 800 mg by mouth every 8 (eight) hours as needed. for pain   No facility-administered medications prior to visit.    Review of Systems  Constitutional: Negative.   Respiratory: Negative.   Cardiovascular: Negative.   Genitourinary: Negative.   Neurological: Negative.       Objective    BP 120/80 (BP Location: Left Arm, Patient Position: Sitting, Cuff Size: Normal)   Pulse (!) 58   Temp (!) 96.8 F (36 C) (Temporal)   Wt 163 lb 9.6 oz (74.2 kg)   SpO2 99%   BMI 26.41 kg/m    Physical Exam Constitutional:      Appearance: Normal appearance. He is normal weight.  HENT:     Mouth/Throat:     Mouth: Mucous membranes are moist.   Pharynx: Oropharynx is clear.  Cardiovascular:     Heart sounds: Normal heart sounds.  Pulmonary:     Effort: Pulmonary effort is normal.  Skin:    General: Skin is warm and dry.  Neurological:     General: No focal deficit present.     Mental Status: He is alert and oriented to person, place, and time.  Psychiatric:        Mood and Affect: Mood normal.        Behavior: Behavior normal.       No results found for any visits on 02/10/20.  Assessment & Plan    1. Throat pain  DDx: esophageal injury, structural issue including web/rings, GERD, anxiety symptom. Counseled patient about various causes. I would expect if he had an esophageal injury he would have had symptoms since his D/C but he has not. He has not coughed up any food. May try pepcid and anti-anxiety medicine to see if this helps symptoms. Counseled we may order barium swallow but an endoscopy may be more beneficial to him. Encouraged him to reach out to GI and scheduled.   - hydrOXYzine (ATARAX/VISTARIL) 10 MG tablet; Take 1 tablet (10 mg total) by mouth 3 (three) times daily as needed.  Dispense: 30 tablet; Refill: 0    Return if symptoms worsen or fail to improve.      Nikki Dom  Jodi Marble, PA-C, have reviewed all documentation for this visit. The documentation on 02/10/20 for the exam, diagnosis, procedures, and orders are all accurate and complete.    Maryella Shivers  [facility] [phone] (phone) [phone] (fax)  [facility]

## 2020-02-25 ENCOUNTER — Encounter: Payer: Self-pay | Admitting: Gastroenterology

## 2020-02-25 ENCOUNTER — Ambulatory Visit (INDEPENDENT_AMBULATORY_CARE_PROVIDER_SITE_OTHER): Payer: BC Managed Care – PPO | Admitting: Gastroenterology

## 2020-02-25 ENCOUNTER — Other Ambulatory Visit: Payer: Self-pay

## 2020-02-25 VITALS — BP 97/65 | HR 67 | Temp 98.0°F | Ht 66.0 in | Wt 163.2 lb

## 2020-02-25 DIAGNOSIS — K56600 Partial intestinal obstruction, unspecified as to cause: Secondary | ICD-10-CM

## 2020-02-25 DIAGNOSIS — Z8719 Personal history of other diseases of the digestive system: Secondary | ICD-10-CM

## 2020-02-25 MED ORDER — SUTAB 1479-225-188 MG PO TABS: 12.0000 | ORAL_TABLET | Freq: Two times a day (BID) | ORAL | 0 refills | Status: DC

## 2020-02-25 NOTE — Progress Notes (Signed)
Christian Darby, MD [address]  [place]  [place], [place] [address]  Main: [phone]  Fax: [phone]    Gastroenterology Consultation  Referring Provider:     Trinna Post, PA-C Primary Care Physician:  Trinna Post, PA-C Primary Gastroenterologist:  Dr. Cephas Bentley Reason for Consultation:     History of small bowel obstruction        HPI:   Christian Bentley is a 44 y.o. male referred by Dr. Trinna Post, PA-C  for consultation & management of recent history of small bowel obstruction.  Patient reports that on Memorial Day weekend when he was at [facility], he developed sudden onset of upper abdominal pain, went to ER, he reported that CT revealed blockage in his lower intestine 9.  Therefore he had an NG tube for decompression for over 12 hours.  Subsequently, he had a bowel movement, diet was advanced which he tolerated.  He was also reported that there was no other abnormality in his intestinal tract.  I do not have a copy of the records with me today.  He also reports that 1 year ago, his best friend passed away from esophageal cancer at age 53.  He said he has been anxious.  Since this episode of SBO, patient was doing well until 3 weeks ago when he had choking episode while he was eating a hot dog.  He then saw his PCP, who felt it was secondary to anxiety.  Patient has been doing well since then.  He is very active, runs 20 miles per week, exercises regularly. Does not smoke or drink alcohol  Patient also reports he had a blockage when he was 44 years old of unclear etiology  NSAIDs: None  Antiplts/Anticoagulants/Anti thrombotics: None  GI Procedures: None He denies family history of GI malignancy  Past Medical History:  Diagnosis Date  . Allergic rhinitis   . History of herpes labialis     Past Surgical History:  Procedure Laterality Date  . ANAL FISSURE REPAIR  05/2011  . NECK SURGERY  2009   growth in neck, biopsy done  showed no cancer   No current outpatient medications on file.    Family History  Problem Relation Age of Onset  . Cancer Mother        brain  . Diabetes Mother   . Kidney Stones Father   . Healthy Sister   . Healthy Brother   . Healthy Daughter   . Healthy Son   . Healthy Sister   . Healthy Brother   . Healthy Brother   . Healthy Brother      Social History   Tobacco Use  . Smoking status: Never Smoker  . Smokeless tobacco: Never Used  Substance Use Topics  . Alcohol use: Yes    Alcohol/week: 0.0 standard drinks    Comment: occasional  . Drug use: Yes    Types: Marijuana    Comment: history of use    Allergies as of 02/25/2020  . (No Known Allergies)    Review of Systems:    All systems reviewed and negative except where noted in HPI.   Physical Exam:  BP 97/65 (BP Location: Left Arm, Patient Position: Sitting, Cuff Size: Normal)   Pulse 67   Temp 98 F (36.7 C) (Oral)   Ht 5\' 6"  (1.676 m)   Wt 163 lb 4 oz (74 kg)   BMI 26.35 kg/m  No LMP for male patient.  General:   Alert,  Well-developed, well-nourished, pleasant and cooperative in NAD Head:  Normocephalic and atraumatic. Eyes:  Sclera clear, no icterus.   Conjunctiva pink. Ears:  Normal auditory acuity. Nose:  No deformity, discharge, or lesions. Mouth:  No deformity or lesions,oropharynx pink & moist. Neck:  Supple; no masses or thyromegaly. Lungs:  Respirations even and unlabored.  Clear throughout to auscultation.   No wheezes, crackles, or rhonchi. No acute distress. Heart:  Regular rate and rhythm; no murmurs, clicks, rubs, or gallops. Abdomen:  Normal bowel sounds. Soft, non-tender and non-distended without masses, hepatosplenomegaly or hernias noted.  No guarding or rebound tenderness.   Rectal: Not performed Msk:  Symmetrical without gross deformities. Good, equal movement & strength bilaterally. Pulses:  Normal pulses noted. Extremities:  No clubbing or edema.  No cyanosis. Neurologic:   Alert and oriented x3;  grossly normal neurologically. Skin:  Intact without significant lesions or rashes. No jaundice. Lymph Nodes:  No significant cervical adenopathy. Psych:  Alert and cooperative. Normal mood and affect.  Imaging Studies: None  Assessment and Plan:   Zac Torti is a 44 y.o. male with no significant past medical history is seen in consultation for recent episode of bowel obstruction, unclear etiology  Recommend EGD and colonoscopy for further evaluation  I have discussed alternative options, risks & benefits,  which include, but are not limited to, bleeding, infection, perforation,respiratory complication & drug reaction.  The patient agrees with this plan & written consent will be obtained.      Follow up based on the above work-up   Arlyss Repress, MD

## 2020-03-16 ENCOUNTER — Other Ambulatory Visit: Admission: RE | Admit: 2020-03-16 | Payer: BC Managed Care – PPO | Source: Ambulatory Visit

## 2020-04-17 ENCOUNTER — Telehealth: Payer: BC Managed Care – PPO | Admitting: Nurse Practitioner

## 2020-04-17 DIAGNOSIS — B001 Herpesviral vesicular dermatitis: Secondary | ICD-10-CM

## 2020-04-17 MED ORDER — VALACYCLOVIR HCL 1 G PO TABS
ORAL_TABLET | ORAL | 0 refills | Status: DC
Start: 2020-04-17 — End: 2023-05-25

## 2020-04-17 NOTE — Progress Notes (Signed)
We are sorry that you are not feeling well.  Here is how we plan to help!  Based on what you have shared with me it does look like you have a viral infection.    Most cold sores or fever blisters are small fluid filled blisters around the mouth caused by herpes simplex virus.  The most common strain of the virus causing cold sores is herpes simplex virus 1.  It can be spread by skin contact, sharing eating utensils, or even sharing towels.  Cold sores are contagious to other people until dry. (Approximately 5-7 days).  Wash your hands. You can spread the virus to your eyes through handling your contact lenses after touching the lesions.  Most people experience pain at the sight or tingling sensations in their lips that may begin before the ulcers erupt.  Herpes simplex is treatable but not curable.  It may lie dormant for a long time and then reappear due to stress or prolonged sun exposure.  Many patients have success in treating their cold sores with an over the counter topical called Abreva.  You may apply the cream up to 5 times daily (maximum 10 days) until healing occurs.  If you would like to use an oral antiviral medication to speed the healing of your cold sore, I have sent a prescription to your local pharmacy Valacyclovir 2 gm take one by mouth twice a day for 1 day    HOME CARE:   Wash your hands frequently.  Do not pick at or rub the sore.  Don't open the blisters.  Avoid kissing other people during this time.  Avoid sharing drinking glasses, eating utensils, or razors.  Do not handle contact lenses unless you have thoroughly washed your hands with soap and warm water!  Avoid oral sex during this time.  Herpes from sores on your mouth can spread to your partner's genital area.  Avoid contact with anyone who has eczema or a weakened immune system.  Cold sores are often triggered by exposure to intense sunlight, use a lip balm containing a sunscreen (SPF 30 or  higher).  GET HELP RIGHT AWAY IF:   Blisters look infected.  Blisters occur near or in the eye.  Symptoms last longer than 10 days.  Your symptoms become worse.  MAKE SURE YOU:   Understand these instructions.  Will watch your condition.  Will get help right away if you are not doing well or get worse.    Your e-visit answers were reviewed by a board certified advanced clinical practitioner to complete your personal care plan.  Depending upon the condition, your plan could have  Included both over the counter or prescription medications.    Please review your pharmacy choice.  Be sure that the pharmacy you have chosen is open so that you can pick up your prescription now.  If there is a problem you can message your provider in MyChart to have the prescription routed to another pharmacy.    Your safety is important to [place].  If you have drug allergies check our prescription carefully.  For the next 24 hours you can use MyChart to ask questions about today's visit, request a non-urgent call back, or ask for a work or school excuse from your e-visit provider.  You will get an email in the next two days asking about your experience.  I hope that your e-visit has been valuable and will speed your recovery. Sore   5-10 minutes spent reviewing and  and documenting in chart.  

## 2020-06-01 ENCOUNTER — Inpatient Hospital Stay: Admission: RE | Admit: 2020-06-01 | Payer: BC Managed Care – PPO | Source: Ambulatory Visit

## 2020-06-03 ENCOUNTER — Ambulatory Visit
Admission: RE | Admit: 2020-06-03 | Payer: BC Managed Care – PPO | Source: Home / Self Care | Admitting: Gastroenterology

## 2020-06-03 ENCOUNTER — Encounter: Admission: RE | Payer: Self-pay | Source: Home / Self Care

## 2020-06-03 SURGERY — COLONOSCOPY WITH PROPOFOL
Anesthesia: General

## 2020-07-07 ENCOUNTER — Telehealth (INDEPENDENT_AMBULATORY_CARE_PROVIDER_SITE_OTHER): Payer: BC Managed Care – PPO | Admitting: Physician Assistant

## 2020-07-07 DIAGNOSIS — R509 Fever, unspecified: Secondary | ICD-10-CM

## 2020-07-07 DIAGNOSIS — R6883 Chills (without fever): Secondary | ICD-10-CM

## 2020-07-07 NOTE — Progress Notes (Signed)
MyChart Video Visit    Virtual Visit via Video Note   This visit type was conducted due to national recommendations for restrictions regarding the COVID-19 Pandemic (e.g. social distancing) in an effort to limit this patient's exposure and mitigate transmission in our community. This patient is at least at moderate risk for complications without adequate follow up. This format is felt to be most appropriate for this patient at this time. Physical exam was limited by quality of the video and audio technology used for the visit.   Patient location: Home Provider location: Office   I discussed the limitations of evaluation and management by telemedicine and the availability of in person appointments. The patient expressed understanding and agreed to proceed.  Patient: Christian Bentley   DOB: June 09, 1976   44 y.o. Male  MRN: [id number] Visit Date: 07/07/2020  Today's healthcare provider: Trey Sailors, PA-C   Chief Complaint  Patient presents with  . URI   Subjective    URI  The maximum temperature recorded prior to his arrival was 100.4 - 100.9 F. Associated symptoms include congestion, coughing, headaches, sneezing and a sore throat. Pertinent negatives include no ear pain. He has tried acetaminophen and decongestant for the symptoms. The treatment provided mild relief.    Patient reports that his temp was up to 100.1. He has taken OTC sinus meds and Nyquil with minimal relief. Denies shortness of breath. Reports he is feeling somewhat better. He does have some PND.     Medications: Outpatient Medications Prior to Visit  Medication Sig  . Sodium Sulfate-Mag Sulfate-KCl (SUTAB) [phone] MG TABS Take 12 tablets by mouth in the morning and at bedtime. At 5pm take 12 tablets and then 5 hours prior to colonoscopy take the other 12.  . valACYclovir (VALTREX) 1000 MG tablet 2 po 2x a day for 1 day at fever blister onset   No facility-administered medications prior to visit.     Review of Systems  HENT: Positive for congestion, sneezing and sore throat. Negative for ear pain.   Respiratory: Positive for cough.   Neurological: Positive for headaches.      Objective    There were no vitals taken for this visit.   Physical Exam Constitutional:      Appearance: Normal appearance.  Pulmonary:     Effort: Pulmonary effort is normal. No respiratory distress.  Neurological:     Mental Status: He is alert.  Psychiatric:        Mood and Affect: Mood normal.        Behavior: Behavior normal.        Assessment & Plan    1. Fever, unspecified fever cause  - symptoms and exam c/w viral URI  - no evidence of strep pharyngitis, CAP, AOM, bacterial sinusitis, or other bacterial infection - concern for possible COVID19 infection - will send for outpatient testing - discussed need to quarantine 10 days from start of symptoms and until fever-free for at least 48 hours - discussed need to quarantine household members - discussed symptomatic management, natural course, and return precautions  - COVID-19, Flu A+B and RSV  2. Chills  - COVID-19, Flu A+B and RSV   No follow-ups on file.     I discussed the assessment and treatment plan with the patient. The patient was provided an opportunity to ask questions and all were answered. The patient agreed with the plan and demonstrated an understanding of the instructions.   The patient was advised to call  back or seek an in-person evaluation if the symptoms worsen or if the condition fails to improve as anticipated.   ITrey Sailors, PA-C, have reviewed all documentation for this visit. The documentation on 07/07/20 for the exam, diagnosis, procedures, and orders are all accurate and complete.  The entirety of the information documented in the History of Present Illness, Review of Systems and Physical Exam were personally obtained by me. Portions of this information were initially documented by [facility] and reviewed by me for thoroughness and accuracy.    Maryella Shivers [facility] [phone] (phone) [phone] (fax)  [facility]

## 2020-07-08 LAB — COVID-19, FLU A+B AND RSV
Influenza A, NAA: NOT DETECTED
Influenza B, NAA: NOT DETECTED
RSV, NAA: NOT DETECTED
SARS-CoV-2, NAA: NOT DETECTED

## 2020-09-29 NOTE — Progress Notes (Signed)
Complete physical exam   Patient: Christian Bentley   DOB: 05/08/1976   45 y.o. Male  MRN: [id number] Visit Date: 09/30/2020  Today's healthcare provider: Trey Sailors, PA-C   Chief Complaint  Patient presents with  . Annual Exam  I,Caliph Borowiak M Destany Severns,acting as a scribe for Trey Sailors, PA-C.,have documented all relevant documentation on the behalf of Trey Sailors, PA-C,as directed by  Trey Sailors, PA-C while in the presence of Trey Sailors, PA-C.  Subjective    Christian Bentley is a 45 y.o. male who presents today for a complete physical exam.  He reports consuming a general diet. Home exercise routine includes running. He generally feels well. He reports sleeping well. He does not have additional problems to discuss today.  HPI    Past Medical History:  Diagnosis Date  . Allergic rhinitis   . History of herpes labialis    Past Surgical History:  Procedure Laterality Date  . ANAL FISSURE REPAIR  05/2011  . NECK SURGERY  2009   growth in neck, biopsy done showed no cancer   Social History   Socioeconomic History  . Marital status: Married    Spouse name: Not on file  . Number of children: Not on file  . Years of education: Not on file  . Highest education level: Not on file  Occupational History  . Not on file  Tobacco Use  . Smoking status: Never Smoker  . Smokeless tobacco: Never Used  Substance and Sexual Activity  . Alcohol use: Yes    Alcohol/week: 0.0 standard drinks    Comment: occasional  . Drug use: Yes    Types: Marijuana    Comment: history of use  . Sexual activity: Not on file  Other Topics Concern  . Not on file  Social History Narrative  . Not on file   Social Determinants of Health   Financial Resource Strain: Not on file  Food Insecurity: Not on file  Transportation Needs: Not on file  Physical Activity: Not on file  Stress: Not on file  Social Connections: Not on file  Intimate Partner Violence: Not on file    Family Status  Relation Name Status  . Mother  Deceased       cancer  . Father  Alive  . Sister  Alive  . Brother  Alive  . Daughter  Alive  . Son  Alive  . Sister  Alive  . Brother  Alive  . Brother  Alive  . Brother  Alive   Family History  Problem Relation Age of Onset  . Cancer Mother        brain  . Diabetes Mother   . Kidney Stones Father   . Healthy Sister   . Healthy Brother   . Healthy Daughter   . Healthy Son   . Healthy Sister   . Healthy Brother   . Healthy Brother   . Healthy Brother    No Known Allergies  Patient Care Team: Maryella Shivers as PCP - General (Physician Assistant)   Medications: Outpatient Medications Prior to Visit  Medication Sig  . Sodium Sulfate-Mag Sulfate-KCl (SUTAB) [phone] MG TABS Take 12 tablets by mouth in the morning and at bedtime. At 5pm take 12 tablets and then 5 hours prior to colonoscopy take the other 12.  . valACYclovir (VALTREX) 1000 MG tablet 2 po 2x a day for 1 day at fever blister onset   No facility-administered  medications prior to visit.    Review of Systems  Constitutional: Negative.   HENT: Negative.   Eyes: Negative.   Respiratory: Negative.   Cardiovascular: Negative.   Gastrointestinal: Negative.   Endocrine: Negative.   Genitourinary: Negative.   Musculoskeletal: Negative.   Skin: Negative.   Allergic/Immunologic: Negative.   Neurological: Negative.   Hematological: Negative.   Psychiatric/Behavioral: Negative.       Objective    BP 106/75 (BP Location: Left Arm, Patient Position: Sitting, Cuff Size: Large)   Pulse 70   Temp 98.3 F (36.8 C) (Oral)   Ht 5\' 6"  (1.676 m)   Wt 169 lb 14.4 oz (77.1 kg)   SpO2 100%   BMI 27.42 kg/m    Physical Exam Constitutional:      Appearance: Normal appearance.  HENT:     Right Ear: Tympanic membrane, ear canal and external ear normal.     Left Ear: Tympanic membrane, ear canal and external ear normal.  Cardiovascular:     Rate  and Rhythm: Normal rate and regular rhythm.     Pulses: Normal pulses.     Heart sounds: Normal heart sounds.  Pulmonary:     Effort: Pulmonary effort is normal.     Breath sounds: Normal breath sounds.  Abdominal:     General: Abdomen is flat. Bowel sounds are normal.     Palpations: Abdomen is soft.  Skin:    General: Skin is warm and dry.  Neurological:     General: No focal deficit present.     Mental Status: He is alert and oriented to person, place, and time.  Psychiatric:        Mood and Affect: Mood normal.        Behavior: Behavior normal.       Last depression screening scores PHQ 2/9 Scores 09/30/2020 07/24/2019 07/21/2015  PHQ - 2 Score 0 0 0  PHQ- 9 Score 0 0 -   Last fall risk screening Fall Risk  09/30/2020  Falls in the past year? 0  Number falls in past yr: 0  Injury with Fall? 0  Risk for fall due to : No Fall Risks  Follow up Falls evaluation completed   Last Audit-C alcohol use screening Alcohol Use Disorder Test (AUDIT) 09/30/2020  1. How often do you have a drink containing alcohol? 3  2. How many drinks containing alcohol do you have on a typical day when you are drinking? 0  3. How often do you have six or more drinks on one occasion? 0  AUDIT-C Score 3  4. How often during the last year have you found that you were not able to stop drinking once you had started? 0  5. How often during the last year have you failed to do what was normally expected from you because of drinking? 0  6. How often during the last year have you needed a first drink in the morning to get yourself going after a heavy drinking session? 0  7. How often during the last year have you had a feeling of guilt of remorse after drinking? 0  8. How often during the last year have you been unable to remember what happened the night before because you had been drinking? 0  9. Have you or someone else been injured as a result of your drinking? 0  10. Has a relative or friend or a doctor  or another health worker been concerned about your drinking or suggested you  cut down? 0  Alcohol Use Disorder Identification Test Final Score (AUDIT) 3   A score of 3 or more in women, and 4 or more in men indicates increased risk for alcohol abuse, EXCEPT if all of the points are from question 1   No results found for any visits on 09/30/20.  Assessment & Plan    Routine Health Maintenance and Physical Exam  Exercise Activities and Dietary recommendations Goals   None     Immunization History  Administered Date(s) Administered  . Influenza,inj,Quad PF,6+ Mos 07/21/2015, 07/24/2019  . PFIZER(Purple Top)SARS-COV-2 Vaccination 11/28/2019, 12/23/2019  . Td 10/25/1996  . Tdap 12/25/2012    Health Maintenance  Topic Date Due  . Hepatitis C Screening  Never done  . COVID-19 Vaccine (3 - Booster for Pfizer series) 10/16/2020 (Originally 06/23/2020)  . INFLUENZA VACCINE  12/03/2020 (Originally 04/05/2020)  . TETANUS/TDAP  12/26/2022  . HIV Screening  Completed    Discussed health benefits of physical activity, and encouraged him to engage in regular exercise appropriate for his age and condition.  1. Annual physical exam  - TSH - Lipid panel - Comprehensive metabolic panel - CBC with Differential/Platelet  2. Need for hepatitis C screening test  - Hepatitis C antibody  3. Screening for diabetes mellitus  - HgB A1c  4. Hyperlipidemia, unspecified hyperlipidemia type    Return in about 1 year (around 09/30/2021).     ITrey Sailors, PA-C, have reviewed all documentation for this visit. The documentation on 09/30/20 for the exam, diagnosis, procedures, and orders are all accurate and complete.    Maryella Shivers  [facility] [phone] (phone) [phone] (fax)  [facility]

## 2020-09-30 ENCOUNTER — Encounter: Payer: Self-pay | Admitting: Physician Assistant

## 2020-09-30 ENCOUNTER — Other Ambulatory Visit: Payer: Self-pay

## 2020-09-30 ENCOUNTER — Ambulatory Visit (INDEPENDENT_AMBULATORY_CARE_PROVIDER_SITE_OTHER): Payer: BC Managed Care – PPO | Admitting: Physician Assistant

## 2020-09-30 VITALS — BP 106/75 | HR 70 | Temp 98.3°F | Ht 66.0 in | Wt 169.9 lb

## 2020-09-30 DIAGNOSIS — E785 Hyperlipidemia, unspecified: Secondary | ICD-10-CM | POA: Diagnosis not present

## 2020-09-30 DIAGNOSIS — Z131 Encounter for screening for diabetes mellitus: Secondary | ICD-10-CM | POA: Diagnosis not present

## 2020-09-30 DIAGNOSIS — Z0000 Encounter for general adult medical examination without abnormal findings: Secondary | ICD-10-CM

## 2020-09-30 DIAGNOSIS — Z1159 Encounter for screening for other viral diseases: Secondary | ICD-10-CM | POA: Diagnosis not present

## 2020-09-30 NOTE — Patient Instructions (Signed)

## 2021-02-02 DIAGNOSIS — H52223 Regular astigmatism, bilateral: Secondary | ICD-10-CM | POA: Diagnosis not present

## 2021-08-23 ENCOUNTER — Telehealth: Payer: Self-pay

## 2021-08-23 ENCOUNTER — Other Ambulatory Visit: Payer: Self-pay

## 2021-08-23 ENCOUNTER — Encounter: Payer: Self-pay | Admitting: Family Medicine

## 2021-08-23 ENCOUNTER — Ambulatory Visit (INDEPENDENT_AMBULATORY_CARE_PROVIDER_SITE_OTHER): Payer: BC Managed Care – PPO | Admitting: Family Medicine

## 2021-08-23 VITALS — BP 106/70 | HR 80 | Temp 99.5°F | Resp 16 | Wt 171.0 lb

## 2021-08-23 DIAGNOSIS — J101 Influenza due to other identified influenza virus with other respiratory manifestations: Secondary | ICD-10-CM | POA: Diagnosis not present

## 2021-08-23 LAB — POCT INFLUENZA A/B
Influenza A, POC: POSITIVE — AB
Influenza B, POC: NEGATIVE

## 2021-08-23 MED ORDER — OSELTAMIVIR PHOSPHATE 75 MG PO CAPS: 75.0000 mg | ORAL_CAPSULE | Freq: Two times a day (BID) | ORAL | 0 refills | Status: AC

## 2021-08-23 NOTE — Progress Notes (Signed)
Established patient visit   Patient: Christian Bentley   DOB: 01/20/1976   45 y.o. Male  MRN: [id number] Visit Date: 08/23/2021  Today's healthcare provider: Shirlee Latch, MD   Chief Complaint  Patient presents with   URI   I,Sulibeya S Dimas,acting as a scribe for Shirlee Latch, MD.,have documented all relevant documentation on the behalf of Shirlee Latch, MD,as directed by  Shirlee Latch, MD while in the presence of Shirlee Latch, MD.  Subjective    HPI  Upper respiratory symptoms He complains of achiness, congestion, cough described as productive, fever 101, nasal congestion, sinus pressure, sneezing, and wheezing.with chills. Onset of symptoms was yesterday and worsening.He is drinking plenty of fluids.  Past history is significant for no history of pneumonia or bronchitis. Patient is non-smoker. Patient has taken Tylenol at 10 am 1000mg . Patient did take a home COVID test and reports it was negative. Patient reports his son is also sick with flu and step.  ---------------------------------------------------------------------------------------------------    Medications: Outpatient Medications Prior to Visit  Medication Sig   valACYclovir (VALTREX) 1000 MG tablet 2 po 2x a day for 1 day at fever blister onset   Sodium Sulfate-Mag Sulfate-KCl (SUTAB) [phone] MG TABS Take 12 tablets by mouth in the morning and at bedtime. At 5pm take 12 tablets and then 5 hours prior to colonoscopy take the other 12.   No facility-administered medications prior to visit.    Review of Systems  Constitutional:  Positive for activity change, appetite change, chills, fatigue and fever.  Respiratory:  Positive for cough and wheezing. Negative for shortness of breath.   Cardiovascular:  Negative for chest pain and palpitations.  Musculoskeletal:  Positive for myalgias.       Objective    BP 106/70 (BP Location: Right Arm, Patient Position: Sitting, Cuff Size: Large)     Pulse 80    Temp 99.5 F (37.5 C) (Oral)    Resp 16    Wt 171 lb (77.6 kg)    SpO2 97%    BMI 27.60 kg/m  BP Readings from Last 3 Encounters:  08/23/21 106/70  09/30/20 106/75  02/25/20 97/65   Wt Readings from Last 3 Encounters:  08/23/21 171 lb (77.6 kg)  09/30/20 169 lb 14.4 oz (77.1 kg)  02/25/20 163 lb 4 oz (74 kg)      Physical Exam Vitals reviewed.  Constitutional:      General: He is not in acute distress.    Appearance: Normal appearance. He is not diaphoretic.  HENT:     Head: Normocephalic and atraumatic.     Nose: Nose normal.     Mouth/Throat:     Mouth: Mucous membranes are moist.     Pharynx: Oropharynx is clear. No oropharyngeal exudate.  Eyes:     General: No scleral icterus.    Conjunctiva/sclera: Conjunctivae normal.  Cardiovascular:     Rate and Rhythm: Normal rate and regular rhythm.     Pulses: Normal pulses.     Heart sounds: Normal heart sounds. No murmur heard. Pulmonary:     Effort: Pulmonary effort is normal. No respiratory distress.     Breath sounds: Normal breath sounds. No wheezing or rhonchi.  Musculoskeletal:     Cervical back: Neck supple.     Right lower leg: No edema.     Left lower leg: No edema.  Lymphadenopathy:     Cervical: No cervical adenopathy.  Skin:    General: Skin is warm and dry.  Findings: No rash.  Neurological:     Mental Status: He is alert and oriented to person, place, and time.  Psychiatric:        Mood and Affect: Mood normal.        Behavior: Behavior normal.      Results for orders placed or performed in visit on 08/23/21  POCT Influenza A/B  Result Value Ref Range   Influenza A, POC Positive (A) Negative   Influenza B, POC Negative Negative    Assessment & Plan     1. Influenza A - new diagnosis - home COVID test negative - no evidence of strep pharyngitis, CAP, bacterial sinusitis, or other bacterial infection - discussed symptomatic management, natural course, and return precautions   - tamiflu x5d - POCT Influenza A/B  Meds ordered this encounter  Medications   oseltamivir (TAMIFLU) 75 MG capsule    Sig: Take 1 capsule (75 mg total) by mouth 2 (two) times daily for 5 days.    Dispense:  10 capsule    Refill:  0     Return if symptoms worsen or fail to improve.      I, Shirlee Latch, MD, have reviewed all documentation for this visit. The documentation on 08/23/21 for the exam, diagnosis, procedures, and orders are all accurate and complete.   Katasha Riga, Marzella Schlein, MD, MPH [facility]

## 2021-08-23 NOTE — Telephone Encounter (Signed)
Copied from CRM [id number]. Topic: Appointment Scheduling - Scheduling Inquiry for Clinic >> Aug 23, 2021  2:41 PM Elliot Gault wrote: patient is taking a home COVID Test and will call back with the results prior to 3pm appointment. Possible strep throat / fever, body ache, chills, cough / [employer] )

## 2021-08-23 NOTE — Telephone Encounter (Addendum)
Noted-patient in office and test was negative.

## 2021-09-30 ENCOUNTER — Encounter: Payer: Self-pay | Admitting: Physician Assistant

## 2023-05-17 ENCOUNTER — Telehealth: Payer: Self-pay

## 2023-05-17 DIAGNOSIS — E782 Mixed hyperlipidemia: Secondary | ICD-10-CM

## 2023-05-17 DIAGNOSIS — Z1159 Encounter for screening for other viral diseases: Secondary | ICD-10-CM

## 2023-05-17 NOTE — Telephone Encounter (Unsigned)
Copied from CRM [id number]. Topic: General - Other >> May 17, 2023 10:48 AM Dondra Prader A wrote: Reason for CRM: Pt called and scheduled his physical for 05/25/2023, pt is wanting to have his labs done before his physical and is wanting to see if PCP can put his lab orders in the system. Please advise.

## 2023-05-19 NOTE — Telephone Encounter (Signed)
Patient aware.

## 2023-05-19 NOTE — Addendum Note (Signed)
Addended by: Marjie Skiff on: 05/19/2023 08:52 AM   Modules accepted: Orders

## 2023-05-19 NOTE — Addendum Note (Signed)
Addended by: Erasmo Downer on: 05/19/2023 08:59 AM   Modules accepted: Orders

## 2023-05-19 NOTE — Telephone Encounter (Signed)
I have pended the labs for you to review and see if he needs other labs added.

## 2023-05-25 ENCOUNTER — Ambulatory Visit (INDEPENDENT_AMBULATORY_CARE_PROVIDER_SITE_OTHER): Payer: 59 | Admitting: Family Medicine

## 2023-05-25 VITALS — BP 113/77 | HR 63 | Ht 65.0 in | Wt 166.6 lb

## 2023-05-25 DIAGNOSIS — Z1211 Encounter for screening for malignant neoplasm of colon: Secondary | ICD-10-CM | POA: Diagnosis not present

## 2023-05-25 DIAGNOSIS — Z0000 Encounter for general adult medical examination without abnormal findings: Secondary | ICD-10-CM

## 2023-05-25 DIAGNOSIS — E782 Mixed hyperlipidemia: Secondary | ICD-10-CM | POA: Diagnosis not present

## 2023-05-25 NOTE — Progress Notes (Signed)
Complete physical exam  Patient: Christian Bentley   DOB: May 07, 1976   47 y.o. Male  MRN: [id number]  Subjective:    Chief Complaint  Patient presents with   Annual Exam    Kartik Sargsyan is a 47 y.o. male who presents today for a complete physical exam. He reports consuming a general diet.  He generally feels well. He reports sleeping well. He does not have additional problems to discuss today.    Discussed the use of AI scribe software for clinical note transcription with the patient, who gave verbal consent to proceed.  History of Present Illness   The patient presents for an annual physical. He reports no current health concerns and is generally feeling well. He had blood work done earlier in the day and is awaiting results. He has not had colon cancer screening before and agreed to start with a Cologuard home test. He believes he received a tetanus shot about five years ago due to a metal-related injury, but does not have documentation.       Most recent fall risk assessment:    09/30/2020    1:58 PM  Fall Risk   Falls in the past year? 0  Number falls in past yr: 0  Injury with Fall? 0  Risk for fall due to : No Fall Risks  Follow up Falls evaluation completed     Most recent depression screenings:    09/30/2020    1:58 PM 07/24/2019    9:13 AM  PHQ 2/9 Scores  PHQ - 2 Score 0 0  PHQ- 9 Score 0 0        Patient Care Team: Erasmo Downer, MD as PCP - General (Family Medicine)   Outpatient Medications Prior to Visit  Medication Sig   valACYclovir (VALTREX) 1000 MG tablet 2 po 2x a day for 1 day at fever blister onset (Patient not taking: Reported on 05/25/2023)   No facility-administered medications prior to visit.    ROS per HPI     Objective:     BP 113/77 (BP Location: Left Arm, Patient Position: Sitting, Cuff Size: Normal)   Pulse 63   Ht 5\' 5"  (1.651 m)   Wt 166 lb 9.6 oz (75.6 kg)   SpO2 99%   BMI 27.72 kg/m    Physical Exam Vitals  reviewed.  Constitutional:      General: He is not in acute distress.    Appearance: Normal appearance. He is well-developed. He is not diaphoretic.  HENT:     Head: Normocephalic and atraumatic.     Right Ear: Tympanic membrane, ear canal and external ear normal.     Left Ear: Tympanic membrane, ear canal and external ear normal.     Nose: Nose normal.     Mouth/Throat:     Mouth: Mucous membranes are moist.     Pharynx: Oropharynx is clear. No oropharyngeal exudate.  Eyes:     General: No scleral icterus.    Conjunctiva/sclera: Conjunctivae normal.     Pupils: Pupils are equal, round, and reactive to light.  Neck:     Thyroid: No thyromegaly.  Cardiovascular:     Rate and Rhythm: Normal rate and regular rhythm.     Heart sounds: Normal heart sounds. No murmur heard. Pulmonary:     Effort: Pulmonary effort is normal. No respiratory distress.     Breath sounds: Normal breath sounds. No wheezing or rales.  Abdominal:     General: There is no distension.  Palpations: Abdomen is soft.     Tenderness: There is no abdominal tenderness.  Musculoskeletal:        General: No deformity.     Cervical back: Neck supple.     Right lower leg: No edema.     Left lower leg: No edema.  Lymphadenopathy:     Cervical: No cervical adenopathy.  Skin:    General: Skin is warm and dry.     Findings: No rash.  Neurological:     Mental Status: He is alert and oriented to person, place, and time. Mental status is at baseline.     Gait: Gait normal.  Psychiatric:        Mood and Affect: Mood normal.        Behavior: Behavior normal.        Thought Content: Thought content normal.      No results found for any visits on 05/25/23.     Assessment & Plan:    Routine Health Maintenance and Physical Exam  Immunization History  Administered Date(s) Administered   Influenza,inj,Quad PF,6+ Mos 07/21/2015, 07/24/2019   PFIZER(Purple Top)SARS-COV-2 Vaccination 11/28/2019, 12/23/2019   Td  10/25/1996   Tdap 12/25/2012    Health Maintenance  Topic Date Due   Hepatitis C Screening  Never done   Colonoscopy  Never done   DTaP/Tdap/Td (3 - Td or Tdap) 12/26/2022   COVID-19 Vaccine (3 - 2023-24 season) 05/07/2023   INFLUENZA VACCINE  12/04/2023 (Originally 04/06/2023)   HIV Screening  Completed   HPV VACCINES  Aged Out    Discussed health benefits of physical activity, and encouraged him to engage in regular exercise appropriate for his age and condition.  Problem List Items Addressed This Visit   None Visit Diagnoses     Encounter for annual physical exam    -  Primary   Mixed hyperlipidemia       Colon cancer screening       Relevant Orders   Cologuard           Colon Cancer Screening Discussed the importance of colon cancer screening starting at age 60. Patient prefers non-invasive screening with Cologuard over colonoscopy. -Order Cologuard test to be mailed to patient's home.  Influenza Vaccination Patient declined flu shot today, plans to get it in November. -Advise patient to get flu shot in November.  Tetanus Vaccination Patient reports receiving tetanus shot approximately 5 years ago due to a metal-related injury, but no documentation in medical record. -Advise patient to provide documentation of previous tetanus shot. -Plan for next tetanus shot in approximately 5 years.  General Health Maintenance / Followup Plans -Results of blood work to be communicated via MyChart. -Schedule annual physical for next year. -Advise patient about availability of COVID booster shot.       Return in about 1 year (around 05/24/2024) for CPE.     Shirlee Latch, MD

## 2023-05-26 LAB — HEPATITIS C ANTIBODY: Hep C Virus Ab: NONREACTIVE

## 2023-05-30 ENCOUNTER — Telehealth: Payer: Self-pay

## 2023-05-30 NOTE — Telephone Encounter (Signed)
Copied from CRM [id number]. Topic: General - Inquiry >> May 29, 2023  2:43 PM De Blanch wrote: Reason for CRM:Pt is calling to f/u on lab results. He stated he came into the office and had them done last Thursday.  Please advise.

## 2023-05-30 NOTE — Telephone Encounter (Signed)
Please review. Dr. B patient. ?

## 2023-06-01 NOTE — Telephone Encounter (Signed)
Called [facility] and lipid panel and CMP was not done.No [facility] requisition was receive for lipid/cmp only for hep C. Per [facility] customer service not able to add since the time frame they keep the blood is only for 7 days and they no longer have the specimen For CMP is for 3 days.)  Called patient and LV-Ok for [facility] Nurse to give message.

## 2024-04-16 ENCOUNTER — Encounter: Payer: Self-pay | Admitting: Physician Assistant

## 2024-04-16 ENCOUNTER — Ambulatory Visit: Payer: Self-pay | Admitting: Physician Assistant

## 2024-04-16 ENCOUNTER — Telehealth: Payer: Self-pay | Admitting: Family Medicine

## 2024-04-16 VITALS — BP 113/78 | HR 69

## 2024-04-16 DIAGNOSIS — B001 Herpesviral vesicular dermatitis: Secondary | ICD-10-CM | POA: Diagnosis not present

## 2024-04-16 DIAGNOSIS — B009 Herpesviral infection, unspecified: Secondary | ICD-10-CM

## 2024-04-16 MED ORDER — VALACYCLOVIR HCL 1 G PO TABS: 2000.0000 mg | ORAL_TABLET | Freq: Two times a day (BID) | ORAL | 0 refills | Status: AC

## 2024-04-16 NOTE — Telephone Encounter (Signed)
 Requesting refill on Valtrex , rx expired.

## 2024-04-16 NOTE — Progress Notes (Signed)
 New Patient Office Visit  Subjective    Patient ID: Christian Bentley, male    DOB: [birth year]  Age: 48 y.o. MRN: [id number]  CC:  Chief Complaint  Patient presents with   Mouth Lesions   Discussed the use of AI scribe software for clinical note transcription with the patient, who gave verbal consent to proceed.  History of Present Illness  A 48 year old male presents with a cold sore.  He experienced the onset of a cold sore this morning, which is painful and located on his lower lip. He has a history of cold sores and previously used medication for treatment, but his prescription has expired. He has not yet tried any treatments for the current outbreak and is seeking medication to alleviate the symptoms.     Outpatient Encounter Medications as of 04/16/2024  Medication Sig   valACYclovir  (VALTREX ) 1000 MG tablet Take 2 tablets (2,000 mg total) by mouth 2 (two) times daily for 1 day.   No facility-administered encounter medications on file as of 04/16/2024.    Past Medical History:  Diagnosis Date   Allergic rhinitis    History of herpes labialis     Past Surgical History:  Procedure Laterality Date   ANAL FISSURE REPAIR  05/2011   NECK SURGERY  2009   growth in neck, biopsy done showed no cancer    Family History  Problem Relation Age of Onset   Cancer Mother        brain   Diabetes Mother    Kidney Stones Father    Healthy Sister    Healthy Brother    Healthy Daughter    Healthy Son    Healthy Sister    Healthy Brother    Healthy Brother    Healthy Brother     Social History   Socioeconomic History   Marital status: Married    Spouse name: Not on file   Number of children: Not on file   Years of education: Not on file   Highest education level: Bachelor's degree (e.g., BA, AB, BS)  Occupational History   Not on file  Tobacco Use   Smoking status: Never   Smokeless tobacco: Never  Substance and Sexual Activity   Alcohol use: Yes    Alcohol/week:  0.0 standard drinks of alcohol    Comment: occasional   Drug use: Yes    Types: Marijuana    Comment: history of use   Sexual activity: Not on file  Other Topics Concern   Not on file  Social History Narrative   Not on file   Social Drivers of Health   Financial Resource Strain: Low Risk  (05/25/2023)   Overall Financial Resource Strain (CARDIA)    Difficulty of Paying Living Expenses: Not very hard  Food Insecurity: No Food Insecurity (05/25/2023)   Hunger Vital Sign    Worried About Running Out of Food in the Last Year: Never true    Ran Out of Food in the Last Year: Never true  Transportation Needs: No Transportation Needs (05/25/2023)   PRAPARE - Administrator, Civil Service (Medical): No    Lack of Transportation (Non-Medical): No  Physical Activity: Sufficiently Active (05/25/2023)   Exercise Vital Sign    Days of Exercise per Week: 3 days    Minutes of Exercise per Session: 60 min  Stress: No Stress Concern Present (05/25/2023)   [employer] of Occupational Health - Occupational Stress Questionnaire    Feeling of  Stress : Not at all  Social Connections: Unknown (05/25/2023)   Social Connection and Isolation Panel    Frequency of Communication with Friends and Family: More than three times a week    Frequency of Social Gatherings with Friends and Family: More than three times a week    Attends Religious Services: Patient declined    Database administrator or Organizations: No    Attends Engineer, structural: Not on file    Marital Status: Married  Intimate Partner Violence: Unknown (12/09/2021)   Received from [facility]   HITS    Physically Hurt: Not on file    Insult or Talk Down To: Not on file    Threaten Physical Harm: Not on file    Scream or Curse: Not on file    Review of Systems  Constitutional:  Negative for chills and fever.  HENT: Negative.    Eyes: Negative.   Respiratory:  Negative for shortness of breath.    Cardiovascular:  Negative for chest pain.  Gastrointestinal: Negative.   Genitourinary: Negative.   Musculoskeletal: Negative.   Skin: Negative.   Neurological: Negative.   Endo/Heme/Allergies: Negative.   Psychiatric/Behavioral: Negative.          Objective    BP 113/78 (BP Location: Left Arm, Patient Position: Sitting)   Pulse 69   Physical Exam Vitals and nursing note reviewed.  Constitutional:      Appearance: Normal appearance.  HENT:     Head: Normocephalic and atraumatic.     Right Ear: External ear normal.     Left Ear: External ear normal.     Nose: Nose normal.     Mouth/Throat:     Lips: Pink. Lesions present.     Mouth: Mucous membranes are moist.     Pharynx: Oropharynx is clear.      Comments: Cluster of small blisters, non-purulent Eyes:     Extraocular Movements: Extraocular movements intact.     Conjunctiva/sclera: Conjunctivae normal.     Pupils: Pupils are equal, round, and reactive to light.  Cardiovascular:     Rate and Rhythm: Normal rate and regular rhythm.     Pulses: Normal pulses.     Heart sounds: Normal heart sounds.  Pulmonary:     Effort: Pulmonary effort is normal.     Breath sounds: Normal breath sounds.  Musculoskeletal:        General: Normal range of motion.     Cervical back: Normal range of motion and neck supple.  Skin:    General: Skin is warm and dry.  Neurological:     General: No focal deficit present.     Mental Status: He is alert and oriented to person, place, and time.  Psychiatric:        Mood and Affect: Mood normal.        Behavior: Behavior normal.        Thought Content: Thought content normal.        Judgment: Judgment normal.        Assessment & Plan:   Problem List Items Addressed This Visit   None Visit Diagnoses       Cold sore    -  Primary   Relevant Medications   valACYclovir  (VALTREX ) 1000 MG tablet      Assessment and Plan Oral herpes simplex (cold sore) Acute cold sore on lip,  previous episodes treated with medication. - Prescribed Valtrex  (valacyclovir ) two doses in one day. - Recommended OTC  topical treatment or ice for stinging relief. Red flags given for prompt reevaluation    I have reviewed the patient's medical history (PMH, PSH, Social History, Family History, Medications, and allergies) , and have been updated if relevant. I spent 20 minutes reviewing chart and  face to face time with patient.   Return if symptoms worsen or fail to improve.   Kirk RAMAN Mayers, PA-C

## 2024-04-16 NOTE — Telephone Encounter (Signed)
 Copied from CRM [id number]. Topic: Clinical - Medication Refill >> Apr 16, 2024 11:15 AM Essie A wrote: Medication: valACYclovir  (VALTREX ) 1000 MG tablet  Has the patient contacted their pharmacy? No, because it's been 2 years since he's had this medication (Agent: If no, request that the patient contact the pharmacy for the refill. If patient does not wish to contact the pharmacy document the reason why and proceed with request.) (Agent: If yes, when and what did the pharmacy advise?)  This is the patient's preferred pharmacy:  [id number] [place], [place] - [address] AT [facility] OF [address] [place] [address] Phone: [phone] Fax: [phone]  Is this the correct pharmacy for this prescription? Yes If no, delete pharmacy and type the correct one.   Has the prescription been filled recently? No  Is the patient out of the medication? No  Has the patient been seen for an appointment in the last year OR does the patient have an upcoming appointment? No  Can we respond through MyChart? Yes  Agent: Please be advised that Rx refills may take up to 3 business days. We ask that you follow-up with your pharmacy.

## 2024-04-16 NOTE — Patient Instructions (Signed)
 VISIT SUMMARY:  You came in today because of a painful cold sore on your lip that started this morning. You have had cold sores before and have used medication to treat them, but your prescription had expired.  YOUR PLAN:  -ORAL HERPES SIMPLEX (COLD SORE): A cold sore is a small blister that usually appears on or around the lips, caused by the herpes simplex virus. For your current cold sore, you have been prescribed Valtrex  (valacyclovir ), which you should take in two doses today.You can also use over-the-counter topical treatments or apply ice to help relieve the stinging.    Cold Sore  A cold sore, also called a fever blister, is a small, fluid-filled sore that forms inside the mouth or on the lips, gums, nose, chin, or cheeks. Cold sores can spread to other parts of the body, such as the eyes, fingers, or genitals. Cold sores can spread from person to person (are contagious) until the sores crust over completely. Most cold sores go away within 2 weeks. What are the causes? Cold sores are caused by an infection from a common type of herpes simplex virus (HSV-1). HSV-1 is closely related to the HSV-2virus, which is the virus that causes genital herpes, but these viruses are not the same. Once a person is infected with HSV-1, the virus remains permanently in the body. HSV-1 is spread from person to person through close contact, such as through kissing, touching the affected area, or sharing personal items such as lip balm, razors, a drinking glass, or eating utensils. What increases the risk? You are more likely to develop this condition if you: Are tired, stressed, or sick. Are menstruating. Are pregnant. Take certain medicines. Are exposed to cold weather or too much sun. What are the signs or symptoms? Symptoms of a cold sore outbreak go through different stages. These are the stages of a cold sore: Tingling, itching, or burning is felt 1-2 days before the outbreak. Fluid-filled  blisters appear on the lips, inside the mouth, on the nose, or on the cheeks. The blisters start to ooze clear fluid. The blisters dry up, and a yellow crust appears in their place. The crust falls off. In some cases, other symptoms can develop during a cold sore outbreak. These can include: Fever. Sore throat. Headache. Muscle aches. Swollen neck glands. How is this diagnosed? This condition is diagnosed based on your medical history and a physical exam. Your health care provider may do a blood test or may swab some fluid from your sore and then examine the swab in the lab. How is this treated? There is no cure for cold sores or HSV-1. There is also no vaccine for HSV-1. Most cold sores go away on their own without treatment within 2 weeks. Medicines cannot make the infection go away, but your health care provider may prescribe medicines to: Help relieve some of the pain associated with the sores. Work to stop the virus from multiplying. Shorten healing time. Medicines may be in the form of creams, gels, pills, or a shot. Follow these instructions at home: Medicines Take or apply over-the-counter and prescription medicines only as told by your health care provider. Use a cotton-tip swab to apply creams or gels to your sores. Ask your health care provider if you can take lysine supplements. Research has found that lysine may help heal the cold sore faster and prevent outbreaks. Sore care  Do not touch the sores or pick the scabs. Wash your hands often with soap and  water for at least 20 seconds. Do not touch your eyes without washing your hands first. Keep the sores clean and dry. If directed, put ice on the sores. To do this: Put ice in a plastic bag. Place a towel between your skin and the bag. Leave the ice on for 20 minutes, 2-3 times a day. Remove the ice if your skin turns bright red. This is very important. If you cannot feel pain, heat, or cold, you have a greater risk of  damage to the area. Eating and drinking Eat a soft, bland diet. Avoid eating hot, cold, or salty foods. Use a straw if it hurts to drink out of a glass. Eat foods that are rich in lysine, such as meat, fish, and dairy products. Avoid sugary foods, chocolates, nuts, and grains. These foods are rich in a nutrient called arginine, which can cause the virus to multiply. Lifestyle Do not kiss, have oral sex, or share personal items until your sores heal. Stress, poor sleep, and being out in the sun can trigger outbreaks. Make sure you: Do activities that help you relax, such as deep breathing exercises or meditation. Get enough sleep. Apply sunscreen on your lips before you go out in the sun. Contact a health care provider if: You have symptoms for more than 2 weeks. You have pus coming from the sores. You have redness that is spreading. You have pain or irritation in your eye. You get sores on your genitals. Your sores do not heal within 2 weeks. You have frequent cold sore outbreaks. Get help right away if: You have a fever and your symptoms suddenly get worse. You have a headache and confusion. You have tiredness (fatigue) or loss of appetite. You have a stiff neck or sensitivity to light. Summary A cold sore, also called a fever blister, is a small, fluid-filled sore that forms inside the mouth or on the lips, gums, nose, chin, or cheeks. Most cold sores go away on their own without treatment within 2 weeks. Your health care provider may prescribe medicines to help relieve some of the pain, work to stop the virus from multiplying, and shorten healing time. Wash your hands often with soap and water for at least 20 seconds. Do not touch your eyes without washing your hands first. Do not kiss, have oral sex, or share personal items until your sores heal. Contact a health care provider if your sores do not heal within 2 weeks. This information is not intended to replace advice given to you  by your health care provider. Make sure you discuss any questions you have with your health care provider. Document Revised: 06/02/2021 Document Reviewed: 06/02/2021 Elsevier Patient Education  2024 [employer].

## 2024-04-17 NOTE — Telephone Encounter (Signed)
 Noted

## 2024-04-17 NOTE — Telephone Encounter (Signed)
 Looks like he was just seen by the mobile [facility] yesterday and Valtrex  was prescribed

## 2024-05-22 ENCOUNTER — Ambulatory Visit

## 2024-05-22 ENCOUNTER — Other Ambulatory Visit: Payer: Self-pay

## 2024-05-22 ENCOUNTER — Ambulatory Visit: Payer: Self-pay

## 2024-05-22 ENCOUNTER — Ambulatory Visit
Admission: RE | Admit: 2024-05-22 | Discharge: 2024-05-22 | Disposition: A | Discharge status: Home / Self Care | Source: Ambulatory Visit

## 2024-05-22 VITALS — BP 95/59 | HR 63 | Resp 16 | Ht 69.0 in | Wt 180.0 lb

## 2024-05-22 DIAGNOSIS — S46211A Strain of muscle, fascia and tendon of other parts of biceps, right arm, initial encounter: Secondary | ICD-10-CM

## 2024-05-22 DIAGNOSIS — B001 Herpesviral vesicular dermatitis: Secondary | ICD-10-CM | POA: Diagnosis not present

## 2024-05-22 MED ORDER — NAPROXEN 500 MG PO TABS: 500.0000 mg | ORAL_TABLET | Freq: Two times a day (BID) | ORAL | 0 refills | Status: AC

## 2024-05-22 MED ORDER — VALACYCLOVIR HCL 1 G PO TABS: 1000.0000 mg | ORAL_TABLET | Freq: Two times a day (BID) | ORAL | 0 refills | Status: AC

## 2024-05-22 NOTE — Progress Notes (Addendum)
 Acute visit  Patient: Christian Bentley   DOB: 11-23-1975   48 y.o. Male  MRN: [id number] PCP: Franchot Isaiah LABOR, MD   Chief Complaint  Patient presents with   New Patient (Initial Visit)    Torn biceps(Rt arm) Pt stated this happened on Sunday   Subjective     Arm pain: - Happened on Sunday, he was lifting a fridge with 2 other guys and the fridge fell on his R arm. He noticed immediate discomfort at location of R biceps. - Has noticed a bulge in his arm since then - Able to drive to work, write, type but has noticed pain - has not tried lifting anything since then  Review of systems as noted in HPI.   Objective    BP (!) 95/59 (BP Location: Left Arm, Patient Position: Sitting, Cuff Size: Normal)   Pulse 63   Resp 16   Ht 5' 9 (1.753 m)   Wt 180 lb (81.6 kg)   SpO2 100%   BMI 26.58 kg/m  Physical Exam Constitutional:      Appearance: Normal appearance.  HENT:     Head: Normocephalic and atraumatic.     Mouth/Throat:     Mouth: Mucous membranes are moist.  Eyes:     Pupils: Pupils are equal, round, and reactive to light.  Pulmonary:     Effort: Pulmonary effort is normal.  Musculoskeletal:     Right shoulder: Decreased range of motion.     Right upper arm: Swelling, deformity and tenderness present. No bony tenderness.     Left upper arm: Normal.     Comments: + Popeye deformity of right biceps, +TTP of bicipital groove. Decreased ROM of R shoulder due to pain.  Skin:    General: Skin is warm.  Neurological:     General: No focal deficit present.     Mental Status: He is alert.      No results found for any visits on 05/22/24.  Assessment & Plan     Problem List Items Addressed This Visit       Digestive   Recurrent cold sores   Patient with cold sore, Valtrex  has helped previously. Will refill Valtrex .      Relevant Medications   valACYclovir  (VALTREX ) 1000 MG tablet     Musculoskeletal and Integument   Tear of right biceps muscle - Primary    Patient with 3 days of R biceps pain after trying to lift a fridge. Felt immediate pain. + Popeye deformity of the R arm, + TTP of bicipital groove. Concerned for proximal biceps tear.  - Will obtain MRI to further evaluate R biceps muscle - Recommend conservative treatment for now with naproxen , ice, rest - Will likely refer to orthopedics pending MRI results      Relevant Medications   naproxen  (NAPROSYN ) 500 MG tablet   Other Relevant Orders   MR HUMERUS RIGHT WO CONTRAST     Meds ordered this encounter  Medications   valACYclovir  (VALTREX ) 1000 MG tablet    Sig: Take 1 tablet (1,000 mg total) by mouth 2 (two) times daily for 1 day.    Dispense:  2 tablet    Refill:  0   naproxen  (NAPROSYN ) 500 MG tablet    Sig: Take 1 tablet (500 mg total) by mouth 2 (two) times daily with a meal for 7 days.    Dispense:  14 tablet    Refill:  0     No follow-ups on  file.      Isaiah DELENA Pepper, MD  [facility] [phone] (phone) [phone] (fax)

## 2024-05-22 NOTE — Assessment & Plan Note (Signed)
 Patient with cold sore, Valtrex  has helped previously. Will refill Valtrex .

## 2024-05-22 NOTE — Patient Instructions (Addendum)
 I prescribed naproxen  (anti-inflammatory medication) for you twice daily for 7 days. Please ice and rest arm. Please get MRI done as we discussed.

## 2024-05-22 NOTE — Assessment & Plan Note (Addendum)
 Patient with 3 days of R biceps pain after trying to lift a fridge. Felt immediate pain. + Popeye deformity of the R arm, + TTP of bicipital groove. Concerned for proximal biceps tear.  - Will obtain MRI to further evaluate R biceps muscle - Recommend conservative treatment for now with naproxen , ice, rest - Will likely refer to orthopedics pending MRI results
